# Patient Record
Sex: Male | Born: 1965
Health system: Southern US, Community
[De-identification: ages and names within clinical notes are randomized; demographics above are authoritative.]

## PROBLEM LIST (undated history)

## (undated) DIAGNOSIS — R569 Unspecified convulsions: Secondary | ICD-10-CM

## (undated) DIAGNOSIS — M109 Gout, unspecified: Secondary | ICD-10-CM

## (undated) DIAGNOSIS — I10 Essential (primary) hypertension: Secondary | ICD-10-CM

## (undated) DIAGNOSIS — L4050 Arthropathic psoriasis, unspecified: Secondary | ICD-10-CM

## (undated) HISTORY — PX: CERVICAL SPINE SURGERY: SHX589

## (undated) HISTORY — DX: Arthropathic psoriasis, unspecified: L40.50

## (undated) HISTORY — DX: Essential (primary) hypertension: I10

## (undated) HISTORY — DX: Gout, unspecified: M10.9

## (undated) HISTORY — DX: Unspecified convulsions: R56.9

---

## 1999-03-08 ENCOUNTER — Encounter: Payer: Self-pay | Admitting: Internal Medicine

## 1999-03-08 ENCOUNTER — Ambulatory Visit (HOSPITAL_COMMUNITY): Admission: RE | Admit: 1999-03-08 | Discharge: 1999-03-08 | Payer: Self-pay | Admitting: Internal Medicine

## 2004-04-24 ENCOUNTER — Emergency Department: Payer: Self-pay | Admitting: Emergency Medicine

## 2004-05-12 ENCOUNTER — Ambulatory Visit: Payer: Self-pay | Admitting: Internal Medicine

## 2004-05-21 ENCOUNTER — Ambulatory Visit: Payer: Self-pay | Admitting: Internal Medicine

## 2004-06-07 ENCOUNTER — Ambulatory Visit: Payer: Self-pay | Admitting: Internal Medicine

## 2004-06-10 ENCOUNTER — Ambulatory Visit: Payer: Self-pay | Admitting: Internal Medicine

## 2004-06-15 ENCOUNTER — Ambulatory Visit: Payer: Self-pay | Admitting: Internal Medicine

## 2004-10-20 ENCOUNTER — Encounter: Admission: RE | Admit: 2004-10-20 | Discharge: 2004-10-20 | Payer: Self-pay | Admitting: Internal Medicine

## 2005-01-18 ENCOUNTER — Ambulatory Visit: Payer: Self-pay | Admitting: Family Medicine

## 2005-02-10 ENCOUNTER — Ambulatory Visit: Payer: Self-pay | Admitting: Family Medicine

## 2005-02-14 ENCOUNTER — Ambulatory Visit: Payer: Self-pay | Admitting: Family Medicine

## 2005-02-28 ENCOUNTER — Ambulatory Visit: Payer: Self-pay | Admitting: Family Medicine

## 2005-03-01 ENCOUNTER — Ambulatory Visit: Payer: Self-pay | Admitting: Cardiology

## 2005-03-02 ENCOUNTER — Ambulatory Visit: Payer: Self-pay | Admitting: Family Medicine

## 2005-05-04 ENCOUNTER — Ambulatory Visit: Payer: Self-pay | Admitting: Family Medicine

## 2005-05-18 ENCOUNTER — Ambulatory Visit: Payer: Self-pay | Admitting: Family Medicine

## 2005-06-20 ENCOUNTER — Ambulatory Visit: Payer: Self-pay | Admitting: Family Medicine

## 2006-06-08 ENCOUNTER — Emergency Department (HOSPITAL_COMMUNITY): Admission: EM | Admit: 2006-06-08 | Discharge: 2006-06-09 | Payer: Self-pay | Admitting: Emergency Medicine

## 2006-06-16 ENCOUNTER — Encounter: Admission: RE | Admit: 2006-06-16 | Discharge: 2006-06-16 | Payer: Self-pay | Admitting: Internal Medicine

## 2006-06-30 ENCOUNTER — Ambulatory Visit: Payer: Self-pay | Admitting: Family Medicine

## 2006-09-25 ENCOUNTER — Inpatient Hospital Stay (HOSPITAL_COMMUNITY): Admission: RE | Admit: 2006-09-25 | Discharge: 2006-09-26 | Payer: Self-pay | Admitting: Neurosurgery

## 2006-12-15 ENCOUNTER — Encounter (INDEPENDENT_AMBULATORY_CARE_PROVIDER_SITE_OTHER): Payer: Self-pay | Admitting: Internal Medicine

## 2006-12-20 ENCOUNTER — Telehealth (INDEPENDENT_AMBULATORY_CARE_PROVIDER_SITE_OTHER): Payer: Self-pay | Admitting: Internal Medicine

## 2007-01-20 ENCOUNTER — Emergency Department: Payer: Self-pay | Admitting: Emergency Medicine

## 2007-02-12 ENCOUNTER — Encounter (INDEPENDENT_AMBULATORY_CARE_PROVIDER_SITE_OTHER): Payer: Self-pay | Admitting: Internal Medicine

## 2007-05-04 ENCOUNTER — Encounter (INDEPENDENT_AMBULATORY_CARE_PROVIDER_SITE_OTHER): Payer: Self-pay | Admitting: Internal Medicine

## 2007-05-14 DIAGNOSIS — L4050 Arthropathic psoriasis, unspecified: Secondary | ICD-10-CM | POA: Insufficient documentation

## 2007-05-28 ENCOUNTER — Ambulatory Visit: Payer: Self-pay | Admitting: Family Medicine

## 2007-05-28 DIAGNOSIS — H6120 Impacted cerumen, unspecified ear: Secondary | ICD-10-CM | POA: Insufficient documentation

## 2007-05-28 DIAGNOSIS — H6121 Impacted cerumen, right ear: Secondary | ICD-10-CM | POA: Insufficient documentation

## 2007-07-06 ENCOUNTER — Encounter (INDEPENDENT_AMBULATORY_CARE_PROVIDER_SITE_OTHER): Payer: Self-pay | Admitting: Internal Medicine

## 2007-07-11 ENCOUNTER — Emergency Department (HOSPITAL_COMMUNITY): Admission: EM | Admit: 2007-07-11 | Discharge: 2007-07-11 | Payer: Self-pay | Admitting: Emergency Medicine

## 2007-09-03 ENCOUNTER — Encounter (INDEPENDENT_AMBULATORY_CARE_PROVIDER_SITE_OTHER): Payer: Self-pay | Admitting: Internal Medicine

## 2007-10-25 ENCOUNTER — Encounter (INDEPENDENT_AMBULATORY_CARE_PROVIDER_SITE_OTHER): Payer: Self-pay | Admitting: Internal Medicine

## 2008-01-17 ENCOUNTER — Ambulatory Visit: Payer: Self-pay | Admitting: Family Medicine

## 2008-01-18 ENCOUNTER — Telehealth (INDEPENDENT_AMBULATORY_CARE_PROVIDER_SITE_OTHER): Payer: Self-pay | Admitting: Internal Medicine

## 2008-01-22 ENCOUNTER — Telehealth: Payer: Self-pay | Admitting: Family Medicine

## 2008-01-26 ENCOUNTER — Ambulatory Visit (HOSPITAL_COMMUNITY): Admission: AD | Admit: 2008-01-26 | Discharge: 2008-01-26 | Payer: Self-pay | Admitting: Internal Medicine

## 2008-01-26 ENCOUNTER — Ambulatory Visit: Payer: Self-pay | Admitting: Internal Medicine

## 2008-01-26 DIAGNOSIS — R05 Cough: Secondary | ICD-10-CM | POA: Insufficient documentation

## 2008-01-26 DIAGNOSIS — R059 Cough, unspecified: Secondary | ICD-10-CM | POA: Insufficient documentation

## 2008-01-26 DIAGNOSIS — R051 Acute cough: Secondary | ICD-10-CM | POA: Insufficient documentation

## 2008-02-20 ENCOUNTER — Encounter (INDEPENDENT_AMBULATORY_CARE_PROVIDER_SITE_OTHER): Payer: Self-pay | Admitting: Internal Medicine

## 2008-02-20 ENCOUNTER — Ambulatory Visit: Payer: Self-pay | Admitting: Family Medicine

## 2008-02-20 DIAGNOSIS — J189 Pneumonia, unspecified organism: Secondary | ICD-10-CM | POA: Insufficient documentation

## 2008-02-26 ENCOUNTER — Telehealth: Payer: Self-pay | Admitting: Family Medicine

## 2008-02-27 ENCOUNTER — Encounter (INDEPENDENT_AMBULATORY_CARE_PROVIDER_SITE_OTHER): Payer: Self-pay | Admitting: Internal Medicine

## 2008-04-14 ENCOUNTER — Ambulatory Visit: Payer: Self-pay | Admitting: Family Medicine

## 2008-04-21 ENCOUNTER — Telehealth: Payer: Self-pay | Admitting: Family Medicine

## 2008-05-13 ENCOUNTER — Telehealth (INDEPENDENT_AMBULATORY_CARE_PROVIDER_SITE_OTHER): Payer: Self-pay | Admitting: Internal Medicine

## 2008-08-02 ENCOUNTER — Emergency Department (HOSPITAL_COMMUNITY): Admission: EM | Admit: 2008-08-02 | Discharge: 2008-08-02 | Payer: Self-pay | Admitting: Emergency Medicine

## 2008-12-25 ENCOUNTER — Encounter: Admission: RE | Admit: 2008-12-25 | Discharge: 2008-12-25 | Payer: Self-pay | Admitting: Internal Medicine

## 2009-01-15 ENCOUNTER — Ambulatory Visit: Payer: Self-pay | Admitting: Family Medicine

## 2009-03-24 ENCOUNTER — Ambulatory Visit: Payer: Self-pay | Admitting: Family Medicine

## 2009-03-24 ENCOUNTER — Encounter (INDEPENDENT_AMBULATORY_CARE_PROVIDER_SITE_OTHER): Payer: Self-pay | Admitting: *Deleted

## 2009-08-26 ENCOUNTER — Ambulatory Visit: Payer: Self-pay | Admitting: Family Medicine

## 2009-08-26 DIAGNOSIS — S8990XA Unspecified injury of unspecified lower leg, initial encounter: Secondary | ICD-10-CM | POA: Insufficient documentation

## 2009-08-26 DIAGNOSIS — S99919A Unspecified injury of unspecified ankle, initial encounter: Secondary | ICD-10-CM | POA: Insufficient documentation

## 2009-08-26 DIAGNOSIS — S99929A Unspecified injury of unspecified foot, initial encounter: Secondary | ICD-10-CM

## 2009-09-30 ENCOUNTER — Encounter: Payer: Self-pay | Admitting: Family Medicine

## 2009-12-29 ENCOUNTER — Ambulatory Visit: Payer: Self-pay | Admitting: Family Medicine

## 2009-12-29 DIAGNOSIS — I10 Essential (primary) hypertension: Secondary | ICD-10-CM | POA: Insufficient documentation

## 2010-01-05 ENCOUNTER — Ambulatory Visit: Payer: Self-pay | Admitting: Family Medicine

## 2010-01-05 LAB — CONVERTED CEMR LAB
Cholesterol: 137 mg/dL (ref 0–200)
Triglycerides: 53 mg/dL (ref 0.0–149.0)

## 2010-02-11 ENCOUNTER — Ambulatory Visit: Payer: Self-pay | Admitting: Family Medicine

## 2010-02-19 ENCOUNTER — Telehealth: Payer: Self-pay | Admitting: Family Medicine

## 2010-06-20 ENCOUNTER — Encounter: Payer: Self-pay | Admitting: Internal Medicine

## 2010-07-01 NOTE — Assessment & Plan Note (Signed)
Summary: hurt foot while playing kick ball/ alc   Vital Signs:  Patient profile:   45 year old male Height:      77 inches Weight:      297 pounds BMI:     35.35 Temp:     97.6 degrees F oral Pulse rate:   76 / minute Pulse rhythm:   regular BP sitting:   126 / 80  (left arm) Cuff size:   large  Vitals Entered By: Delilah Shan CMA [place] Dull) (August 26, 2009 11:40 AM) CC: Hurt foot while playing kickball   History of Present Illness: 45 yo male here for acute injury of left ankle. Was playing kickball yesterday, stepped off on left foot to kick with is righ and felt immediate pain on medial aspect of left ankle and back of foot.  Immediately hurt to bear weight, localized swelling. Did not twist ankle, did not feel anything pop.  Has an aircast at home which he has been wearing from when he sprained his other ankle.  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  Allergies: No Known Drug Allergies  Review of Systems      See HPI MS:  Complains of joint swelling and loss of strength; denies joint redness.  Physical Exam  General:  Well-developed,well-nourished,in no acute distress; alert,appropriate and cooperative throughout examination Msk:  Left foot: no echymosis or erythema, small area of edema on medial aspect.Pos talar tilt test. Very limited range of motion, pain with weight bearing. Psych:  Cognition and judgment appear intact. Alert and cooperative with normal attention span and concentration. No apparent delusions, illusions, hallucinations   Impression & Recommendations:  Problem # 1:  ANKLE INJURY, LEFT (ICD-959.7) Assessment New Given limited range of motion and decreased stability, will refer to ortho immediately for r/u fracture/ ligamentous tear. COntinue Ibuprofen and wearing air cast until he can be seen. Orders: Orthopedic Referral (Ortho)  Complete Medication List: 1)  Lisinopril 20 Mg Tabs  (Lisinopril) .... Take 1 tablet by mouth once a day 2)  Methotrexate 2.5 Mg Tabs (Methotrexate sodium) .... 4 tabs every sat & sun 3)  Enbrel 50 Mg/ml Soln (Etanercept) .Marland Kitchen.. 1 injection every week  Patient Instructions: 1)  Please put aircast on. 2)  Take 400-600 mg of Ibuprofen (Advil, Motrin) with food every 4-6 hours as needed  for relief of pain or comfort. 3)  Please stop by to see Shirlee Limerick.   Current Allergies (reviewed today): No known allergies

## 2010-07-01 NOTE — Assessment & Plan Note (Signed)
Summary: SORE THROAT, CHEST CONGESTION/ 2:30   Vital Signs:  Patient profile:   45 year old male Height:      77 inches Weight:      297.50 pounds BMI:     35.41 Temp:     98.2 degrees F oral Pulse rate:   80 / minute Pulse rhythm:   regular BP sitting:   110 / 80  (left arm) Cuff size:   large  Vitals Entered By: Delilah Shan CMA [place] Dull) (February 11, 2010 2:36 PM) CC: ST, chest congestion.  Patient says his throat burns some but chest hurts with deep breath.   History of Present Illness: Pain with deep breath, near sternum.  ST and burning.  Initially with ST.  Cough at night.  No sputum.  Started Friday.  No fevers.  Arm are achy.  No rash.  No NAVD.  Pain on ant chest when crossing arms on chest.   On enbrel.  Occ wheeze, minimal per patient.   Allergies: No Known Drug Allergies  Past History:  Past Medical History: Last updated: 12/29/2009 psoriatic arthritis- Dr. Kellie Simmering on embrel and mtx no hx lung disease  HTN  Review of Systems       See HPI.  Otherwise negative.    Physical Exam  General:  GEN: nad, alert and oriented HEENT: mucous membranes moist, TM w/o erythema, nasal epithelium injected, OP with cobblestoning NECK: supple w/o LA CV: rrr. PULM: ctab, no inc wob, ant chest wall tender to palpation  ABD: soft, +bs EXT: no edema    Impression & Recommendations:  Problem # 1:  URI (ICD-465.9) I would treat given his immunologic meds.  Nontoxic and okay for outpatient follow up.  Out of work in meantime and supportive tx o/w.  He understands.  follow up as needed.  His updated medication list for this problem includes:    Zyrtec Allergy 10 Mg Tabs (Cetirizine hcl) .Marland Kitchen... Take 1 tablet by mouth once a day    Hydrocodone-homatropine 5-1.5 Mg/37ml Syrp (Hydrocodone-homatropine) .Marland KitchenMarland KitchenMarland KitchenMarland Kitchen 5ml by mouth by mouth q6h as needed for cough  Complete Medication List: 1)  Lisinopril 20 Mg Tabs (Lisinopril) .... Take 1 tablet by mouth once a day 2)  Methotrexate 2.5  Mg Tabs (Methotrexate sodium) .... 4 tabs every sat & sun 3)  Enbrel 50 Mg/ml Soln (Etanercept) .Marland Kitchen.. 1 injection every week 4)  Zyrtec Allergy 10 Mg Tabs (Cetirizine hcl) .... Take 1 tablet by mouth once a day 5)  Zithromax 250 Mg Tabs (Azithromycin) .... 2 by mouth today then 1 by mouth for 4 days. 6)  Hydrocodone-homatropine 5-1.5 Mg/21ml Syrp (Hydrocodone-homatropine) .... 5ml by mouth by mouth q6h as needed for cough  Patient Instructions: 1)  Go back to work when cough resolved.  Potentially contagious.  2)  The cough syrup can make you drowsy.  This should get better.  Let me know if you don't improve.  Prescriptions: HYDROCODONE-HOMATROPINE 5-1.5 MG/5ML SYRP (HYDROCODONE-HOMATROPINE) 5mL by mouth by mouth q6h as needed for cough  #168mL x 0   Entered and Authorized by:   Crawford Givens MD   Signed by:   Crawford Givens MD on 02/11/2010   Method used:   Print then Give to Patient   RxID:   [id number] ZITHROMAX 250 MG TABS (AZITHROMYCIN) 2 by mouth today then 1 by mouth for 4 days.  #6 x 0   Entered and Authorized by:   Crawford Givens MD   Signed by:   Crawford Givens MD  on 02/11/2010   Method used:   Print then Give to Patient   RxID:   [id number]   Current Allergies (reviewed today): No known allergies

## 2010-07-01 NOTE — Progress Notes (Signed)
Summary: hydrocodone  Phone Note Refill Request Message from:  Fax from Pharmacy on February 19, 2010 3:33 PM  Refills Requested: Medication #1:  HYDROCODONE-HOMATROPINE 5-1.5 MG/5ML SYRP 5mL by mouth by mouth q6h as needed for cough.   Last Refilled: 02/11/2010 Refill request from [facility]. [phone]  Initial call taken by: Melody Comas,  February 19, 2010 3:33 PM  Follow-up for Phone Call        ok to refill.  please call in and notify patient. Follow-up by: Eustaquio Boyden  MD,  February 19, 2010 3:40 PM  Additional Follow-up for Phone Call Additional follow up Details #1::        Rx called in as directed and patient is aware Additional Follow-up by: Janee Morn CMA [place] Dull),  February 19, 2010 3:51 PM    Prescriptions: HYDROCODONE-HOMATROPINE 5-1.5 MG/5ML SYRP (HYDROCODONE-HOMATROPINE) 5mL by mouth by mouth q6h as needed for cough  #164mL x 0   Entered and Authorized by:   Eustaquio Boyden  MD   Signed by:   Eustaquio Boyden  MD on 02/19/2010   Method used:   Telephoned to ...       [facility]* (retail)       [address]       [place], [place]  [address]       Ph: [phone]       Fax: [phone]   RxID:   [id number]

## 2010-07-01 NOTE — Assessment & Plan Note (Signed)
Summary: TRANSFER FROM [facility]/REFILL MED/CLE   Vital Signs:  Patient profile:   45 year old male Height:      77 inches Weight:      298.75 pounds BMI:     35.55 Temp:     98.6 degrees F oral Pulse rate:   92 / minute Pulse rhythm:   regular BP sitting:   112 / 70  (left arm) Cuff size:   large  Vitals Entered By: Delilah Shan CMA Timm Dull) (December 29, 2009 4:13 PM) CC: Transfer from [facility]  - Refill Med   History of Present Illness: Sees Dr. Kellie Simmering for RA.  Going on since  ~2006.  Hypertension:      Using medication without problems or lightheadedness: yes Chest pain with exertion:no Edema:no Short of breath:no Average home BPs: not checked but okay at rheum office Other issues: needs refill  Allergies: No Known Drug Allergies  Past History:  Past Medical History: psoriatic arthritis- Dr. 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  Otherwise negative.    Physical Exam  General:  GEN: nad, alert and oriented HEENT: mucous membranes moist NECK: supple w/o LA CV: rrr.  no murmur PULM: ctab, no inc wob ABD: soft, +bs EXT: no edema SKIN: no acute rash    Impression & Recommendations:  Problem # 1:  HYPERTENSION, CONTROLLED (ICD-401.1) No change in meds.  Return for lipids.  D/w patient [id number].  he is not eating breakfast.  He may be able to lose some weight if he spreads he calories out during the day.  Continue exercise, of which he does a lot at work.  His updated medication list for this problem includes:    Lisinopril 20 Mg Tabs (Lisinopril) .Marland Kitchen... Take 1 tablet by mouth once a day  Complete Medication List: 1)  Lisinopril 20 Mg Tabs (Lisinopril) .... Take 1 tablet by mouth once a day 2)  Methotrexate 2.5 Mg Tabs (Methotrexate sodium) .... 4 tabs every sat & sun 3)  Enbrel 50 Mg/ml Soln (Etanercept) .Marland Kitchen.. 1 injection every week 4)  Zyrtec  Allergy 10 Mg Tabs (Cetirizine hcl) .... Take 1 tablet by mouth once a day  Patient Instructions: 1)  Come back for fasting labs.  2)  lipid. 401.1.  Prescriptions: LISINOPRIL 20 MG  TABS (LISINOPRIL) Take 1 tablet by mouth once a day  #90 x 3   Entered and Authorized by:   Crawford Givens MD   Signed by:   Crawford Givens MD on 12/29/2009   Method used:   Electronically to        [facility]* (retail)       [address]       [place], [place]  [address]       Ph: [phone]       Fax: [phone]   RxID:   [id number]   Current Allergies (reviewed today): No known allergies

## 2010-07-02 NOTE — Consult Note (Signed)
Summary: [facility]   Imported By: Lanelle Bal 10/06/2009 11:36:24  _____________________________________________________________________  External Attachment:    Type:   Image     Comment:   External Document

## 2010-08-02 ENCOUNTER — Encounter: Payer: Self-pay | Admitting: Family Medicine

## 2010-08-02 ENCOUNTER — Ambulatory Visit (INDEPENDENT_AMBULATORY_CARE_PROVIDER_SITE_OTHER): Payer: BC Managed Care – PPO | Admitting: Family Medicine

## 2010-08-02 ENCOUNTER — Ambulatory Visit: Payer: Self-pay | Admitting: Family Medicine

## 2010-08-02 DIAGNOSIS — R059 Cough, unspecified: Secondary | ICD-10-CM

## 2010-08-02 DIAGNOSIS — R05 Cough: Secondary | ICD-10-CM

## 2010-08-10 NOTE — Assessment & Plan Note (Signed)
Summary: CHEST CONGESTION/CLE   [employer]   Vital Signs:  Patient profile:   45 year old male Height:      77 inches Weight:      299.25 pounds BMI:     35.61 Temp:     98.8 degrees F oral Pulse rate:   80 / minute Pulse rhythm:   regular BP sitting:   140 / 72  (left arm) Cuff size:   large  Vitals Entered By: Delilah Shan CMA [place] Dull) (August 02, 2010 3:16 PM) CC: Chest congestion   History of Present Illness: Burning ST intially, now in his chest since Friday.  Voice change.  Sx worse at night.  No fevers.  On enbrel and MTX.  No sputum.  Occ wheeze.  Used some SABA prev but not with this episode.  No ear pain.   Occ rhinorrhea.  Can get a deep breath but this triggers cough, as does the cold air. Sweats during the day and chills at night.    Allergies: No Known Drug Allergies  Past History:  Past Medical History: psoriatic arthritis- Dr. Kellie Simmering on embrel and mtx no hx lung disease  HTN SZ in childhood, none since 5th grade  Review of Systems       See HPI.  Otherwise negative.    Physical Exam  General:  GEN: nad, alert and oriented HEENT: mucous membranes moist, TM w/o erythema, nasal epithelium injected, OP with cobblestoning NECK: supple w/o LA CV: rrr. PULM: ctab, no inc wob,cough noted ABD: soft, +bs EXT: no edema    Impression & Recommendations:  Problem # 1:  COUGH (ICD-786.2) I would start the antibiotics given his immune status with his meds.  Nontoxic and follow up as needed.  Supporitve tx o/w.  He agrees.  lungs clear to auscultation bilaterally w/o focal decrease in bs.  I would treat him empirically for bronchitis.  follow up as needed.    Complete Medication List: 1)  Lisinopril 20 Mg Tabs (Lisinopril) .... Take 1 tablet by mouth once a day 2)  Methotrexate 2.5 Mg Tabs (Methotrexate sodium) .... 4 tabs every sat & sun 3)  Enbrel 50 Mg/ml Soln (Etanercept) .Marland Kitchen.. 1 injection every week 4)  Zyrtec Allergy 10 Mg Tabs (Cetirizine hcl) .... Take 1  tablet by mouth once a day 5)  Hydrocodone-homatropine 5-1.5 Mg/50ml Syrp (Hydrocodone-homatropine) .... 5ml by mouth at bedtime for cough 6)  Zithromax 250 Mg Tabs (Azithromycin) .... 2 by mouth today and then 1 by mouth once daily  Patient Instructions: 1)  Use the cough medicine as needed.  It can make you drowsy.  Start the antibiotics today.  Drink plenty of fluids.   Prescriptions: ZITHROMAX 250 MG TABS (AZITHROMYCIN) 2 by mouth today and then 1 by mouth once daily  #6 x 0   Entered and Authorized by:   Crawford Givens MD   Signed by:   Crawford Givens MD on 08/02/2010   Method used:   Print then Give to Patient   RxID:   [id number] HYDROCODONE-HOMATROPINE 5-1.5 MG/5ML SYRP (HYDROCODONE-HOMATROPINE) 5ml by mouth at bedtime for cough  #6oz x 0   Entered and Authorized by:   Crawford Givens MD   Signed by:   Crawford Givens MD on 08/02/2010   Method used:   Print then Give to Patient   RxID:   [id number]    Orders Added: 1)  Est. Patient Level III [42595]   Immunization History:  Tetanus/Td Immunization History:    Tetanus/Td:  historical (01/20/2007)   Immunization History:  Tetanus/Td Immunization History:    Tetanus/Td:  Historical (01/20/2007)  Current Allergies (reviewed today): No known allergies

## 2010-08-17 ENCOUNTER — Other Ambulatory Visit: Payer: Self-pay | Admitting: Family Medicine

## 2010-08-17 MED ORDER — HYDROCODONE-HOMATROPINE 5-1.5 MG/5ML PO SYRP: ORAL_SOLUTION | ORAL | Status: DC

## 2010-08-18 NOTE — Telephone Encounter (Signed)
Phoned in on 08/17/2010

## 2010-08-18 NOTE — Telephone Encounter (Signed)
Dr. Borton patient 

## 2010-09-02 ENCOUNTER — Other Ambulatory Visit: Payer: Self-pay

## 2010-09-02 MED ORDER — HYDROCODONE-HOMATROPINE 5-1.5 MG/5ML PO SYRP: ORAL_SOLUTION | ORAL | Status: DC

## 2010-09-02 NOTE — Telephone Encounter (Signed)
Medication phoned to [facility] pharmacy as instructed. Left message for pt to call back.

## 2010-09-02 NOTE — Telephone Encounter (Signed)
Please advise if refill ok, last refilled 08/17/2010 by Dr.Perkey.

## 2010-09-02 NOTE — Telephone Encounter (Signed)
May refill one more time of one bottle, no refills but if cough not resolved, because of meds he is on which can blunt the immune system, think he needs to be seen again in follow up. This medicine is habit forming as well and is not good to be taken for prolonged periods.

## 2010-09-07 NOTE — Telephone Encounter (Signed)
Patient's wife notified as instructed by telephone. Pt's wife said pt is feeling better but still has cough. She will talk with pt and call back for follow up appt.

## 2010-10-14 ENCOUNTER — Encounter: Payer: Self-pay | Admitting: Family Medicine

## 2010-10-15 ENCOUNTER — Ambulatory Visit: Payer: BC Managed Care – PPO | Admitting: Family Medicine

## 2010-10-15 NOTE — Op Note (Signed)
Foster, Mike                 ACCOUNT NO.:  [id number]   MEDICAL RECORD NO.:  [id number]          PATIENT TYPE:  INP   LOCATION:  3005                         FACILITY:  [facility]   PHYSICIAN:  Coletta Memos, M.D.     DATE OF BIRTH:  [birth year]   DATE OF PROCEDURE:  09/25/2006  DATE OF DISCHARGE:                               OPERATIVE REPORT   PREOPERATIVE DIAGNOSIS:  Cervical spondylosis, cervical displaced disk  with myelopathy, cervical spondylosis with myelopathy, neck pain.   POSTOPERATIVE DIAGNOSES:  Cervical spondylosis, cervical displaced disk  with myelopathy, cervical spondylosis with myelopathy, neck pain.   PROCEDURE:  1. Anterior cervical decompression C5-6.  2. Arthrodesis with 8-mm interbody plugs  3. Anterior instrumentation 16 mm Vector plate.   COMPLICATIONS:  None.   SURGEON:  Coletta Memos, M.D.   ANESTHESIA:  General endotracheal.   INDICATIONS:  Mike Foster is a 45 year old presented with  significant loss of function in his arms and legs.  He had numbness,  tingling sensation in both hands.  Positive Lhermitte sign.  MRI showed  a C5-6 displaced disk and abnormal cord signal at that level.  I  therefore recommended, he agreed to undergo operative decompression.   OPERATIVE NOTE:  Mike Foster was brought to the operating room intubated  and placed under a general anesthetic without difficulty.  His head was  positioned on a horseshoe headrest.  His neck was prepped and he was  draped in sterile fashion.  I infiltrated 4 mL 0.5% lidocaine with  1:200,000 epinephrine starting from the midline extending to the medial  border of the left sternocleidomastoid.  I opened the skin with a #10  blade and I took this down to the platysma.  I opened the platysma in a  horizontal fashion.  I dissected in a plane both superior and inferior  to the platysma rostrally and caudally.  I then with sharp and blunt  dissection created an avascular corridor to the spine  between the strap  muscles and sternocleidomastoid laterally.  I placed a spinal needle and  showed I was at the C5-6 space.  I reflected the longus colli muscles  bilaterally and placed self-retaining retractors.  I opened the disk  space and used curettes to remove some of the disk.  I then placed a  distraction pins one at C5, the other at C6 and distracted the disk  space.   I performed the decompression by removing the rest of the disk using a  high-speed drill and curettes.  Large osteophyte was encountered  posteriorly which I drilled down.  Then very thickened posterior  longitudinal ligament was identified.  I was able to go through that and  remove that using a Kerrison punch.  Both nerve roots were then very  well decompressed on both the right and left side as was the spinal  canal.  I then prepared for the arthrodesis.   I drilled the endplates and evened the inferior endplate of C5 superior  of C6.  I then placed an 8 mm graft after sizing it.  The  graft was  secured into position and felt like a very good fit.  I removed the  distraction pins.  I then placed anterior instrumentation.   I placed an anterior plate 16 mm with two screws at C5 and two screws at  C6.  X-ray showed that the plate, plug and screws were in good position.  I then irrigated the wound.  I then closed wound in layered fashion  using Vicryl sutures.  This was done to reapproximate the platysma and  subcuticular layer.  Dermabond used for sterile dressing.  Mike Foster  tolerated the procedure well.           ______________________________  Coletta Memos, M.D.     KC/MEDQ  D:  09/25/2006  T:  09/26/2006  Job:  [id number]

## 2010-11-29 ENCOUNTER — Other Ambulatory Visit: Payer: Self-pay | Admitting: Rheumatology

## 2010-11-29 DIAGNOSIS — M545 Low back pain, unspecified: Secondary | ICD-10-CM

## 2010-12-04 ENCOUNTER — Ambulatory Visit
Admission: RE | Admit: 2010-12-04 | Discharge: 2010-12-04 | Disposition: A | Discharge status: Home / Self Care | Payer: BC Managed Care – PPO | Source: Ambulatory Visit | Attending: Rheumatology | Admitting: Rheumatology

## 2010-12-04 DIAGNOSIS — M545 Low back pain, unspecified: Secondary | ICD-10-CM

## 2010-12-08 ENCOUNTER — Other Ambulatory Visit: Payer: Self-pay | Admitting: Rheumatology

## 2010-12-08 DIAGNOSIS — M5410 Radiculopathy, site unspecified: Secondary | ICD-10-CM

## 2010-12-09 ENCOUNTER — Ambulatory Visit
Admission: RE | Admit: 2010-12-09 | Discharge: 2010-12-09 | Disposition: A | Discharge status: Home / Self Care | Payer: BC Managed Care – PPO | Source: Ambulatory Visit | Attending: Rheumatology | Admitting: Rheumatology

## 2010-12-09 DIAGNOSIS — M5410 Radiculopathy, site unspecified: Secondary | ICD-10-CM

## 2010-12-09 MED ORDER — METHYLPREDNISOLONE ACETATE 40 MG/ML INJ SUSP (RADIOLOG
120.0000 mg | Freq: Once | INTRAMUSCULAR | Status: AC
  Administered 2010-12-09: 120 mg via EPIDURAL

## 2010-12-31 ENCOUNTER — Encounter: Payer: Self-pay | Admitting: Family Medicine

## 2010-12-31 ENCOUNTER — Ambulatory Visit (INDEPENDENT_AMBULATORY_CARE_PROVIDER_SITE_OTHER): Payer: BC Managed Care – PPO | Admitting: Family Medicine

## 2010-12-31 VITALS — BP 140/90 | HR 68 | Temp 98.5°F | Wt 292.0 lb

## 2010-12-31 DIAGNOSIS — R059 Cough, unspecified: Secondary | ICD-10-CM

## 2010-12-31 DIAGNOSIS — R05 Cough: Secondary | ICD-10-CM

## 2010-12-31 MED ORDER — HYDROCODONE-HOMATROPINE 5-1.5 MG/5ML PO SYRP: ORAL_SOLUTION | ORAL | Status: DC

## 2010-12-31 MED ORDER — AZITHROMYCIN 250 MG PO TABS: ORAL_TABLET | ORAL | Status: DC

## 2010-12-31 NOTE — Progress Notes (Signed)
Sx started last week, tightness in chest.  Progressive cough, no sputum.  No fevers.  On embrel and MTX.  ST, still present.  Chest sx are the most troubling.  Fatigued.  No NAVDR.  No chills/sweats.  No ear pain.  Some facial pain. He's out of work today and this doesn't happen unless he feels notably lousy.   Meds, vitals, and allergies reviewed.   ROS: See HPI.  Otherwise, noncontributory.  GEN: nad, alert and oriented HEENT: mucous membranes moist, tm w/o erythema, nasal exam w/o erythema, some clear and some purulent discharge noted,  OP with cobblestoning NECK: supple w/o LA CV: rrr.   PULM: ctab, no inc wob, dry cough noted EXT: no edema SKIN: no acute rash

## 2010-12-31 NOTE — Patient Instructions (Signed)
Star the antibiotics today.  Drink plenty of fluids, take the cough medicine as needed (it can make you drowsy), and gargle with warm salt water for your throat.  This should gradually improve.  Take care.  Let us know if you have other concerns.

## 2010-12-31 NOTE — Assessment & Plan Note (Addendum)
Given MTX and enbrel, start abx and use cough meds prn.  Okay for outpatient f/u.  I talked to him about the ddx, but given the immunosuppression, I would start the abx.  Call back or f/u prn.  He agrees.

## 2011-01-03 ENCOUNTER — Telehealth: Payer: Self-pay | Admitting: *Deleted

## 2011-01-03 MED ORDER — BENZONATATE 200 MG PO CAPS: 200.0000 mg | ORAL_CAPSULE | Freq: Three times a day (TID) | ORAL | Status: AC | PRN

## 2011-01-03 NOTE — Telephone Encounter (Signed)
I would try tessalon for cough.  I tried to call the pt, but I didn't get an answer.  Please try to call them tomorrow.  I sent the rx.  Thanks.

## 2011-01-03 NOTE — Telephone Encounter (Signed)
Patient's wife says that the patient was seen last week and was given a Rx for some cough medication that had worked for him in the past but now it is not working very well.  Could he try something else?  They use [facility].

## 2011-01-04 NOTE — Telephone Encounter (Signed)
Patient notified

## 2011-01-11 ENCOUNTER — Emergency Department (HOSPITAL_COMMUNITY): Payer: BC Managed Care – PPO

## 2011-01-11 ENCOUNTER — Telehealth: Payer: Self-pay | Admitting: Family Medicine

## 2011-01-11 ENCOUNTER — Emergency Department (HOSPITAL_COMMUNITY)
Admission: EM | Admit: 2011-01-11 | Discharge: 2011-01-11 | Disposition: A | Discharge status: Home / Self Care | Payer: BC Managed Care – PPO | Attending: Emergency Medicine | Admitting: Emergency Medicine

## 2011-01-11 DIAGNOSIS — Z862 Personal history of diseases of the blood and blood-forming organs and certain disorders involving the immune mechanism: Secondary | ICD-10-CM | POA: Insufficient documentation

## 2011-01-11 DIAGNOSIS — M069 Rheumatoid arthritis, unspecified: Secondary | ICD-10-CM | POA: Insufficient documentation

## 2011-01-11 DIAGNOSIS — R05 Cough: Secondary | ICD-10-CM | POA: Insufficient documentation

## 2011-01-11 DIAGNOSIS — K219 Gastro-esophageal reflux disease without esophagitis: Secondary | ICD-10-CM | POA: Insufficient documentation

## 2011-01-11 DIAGNOSIS — R0789 Other chest pain: Secondary | ICD-10-CM | POA: Insufficient documentation

## 2011-01-11 DIAGNOSIS — Z79899 Other long term (current) drug therapy: Secondary | ICD-10-CM | POA: Insufficient documentation

## 2011-01-11 DIAGNOSIS — R42 Dizziness and giddiness: Secondary | ICD-10-CM | POA: Insufficient documentation

## 2011-01-11 DIAGNOSIS — R059 Cough, unspecified: Secondary | ICD-10-CM | POA: Insufficient documentation

## 2011-01-11 DIAGNOSIS — R51 Headache: Secondary | ICD-10-CM | POA: Insufficient documentation

## 2011-01-11 DIAGNOSIS — I10 Essential (primary) hypertension: Secondary | ICD-10-CM | POA: Insufficient documentation

## 2011-01-11 DIAGNOSIS — Z8639 Personal history of other endocrine, nutritional and metabolic disease: Secondary | ICD-10-CM | POA: Insufficient documentation

## 2011-01-11 DIAGNOSIS — R55 Syncope and collapse: Secondary | ICD-10-CM | POA: Insufficient documentation

## 2011-01-11 LAB — COMPREHENSIVE METABOLIC PANEL
ALT: 30 U/L (ref 0–53)
AST: 24 U/L (ref 0–37)
Alkaline Phosphatase: 67 U/L (ref 39–117)
CO2: 25 mEq/L (ref 19–32)
Calcium: 9.4 mg/dL (ref 8.4–10.5)
Chloride: 104 mEq/L (ref 96–112)
GFR calc non Af Amer: >60 mL/min (ref >60)
Potassium: 4.2 mEq/L (ref 3.5–5.1)
Sodium: 138 mEq/L (ref 135–145)
Total Bilirubin: 0.5 mg/dL (ref 0.3–1.2)

## 2011-01-11 LAB — CK TOTAL AND CKMB (NOT AT ARMC)
CK, MB: 3.4 ng/mL (ref 0.3–4.0)
Relative Index: 1.9 (ref 0.0–2.5)
Total CK: 178 U/L (ref 7–232)

## 2011-01-11 LAB — CBC
HCT: 42.7 % (ref 39.0–52.0)
Hemoglobin: 15.3 g/dL (ref 13.0–17.0)
MCHC: 35.8 g/dL (ref 30.0–36.0)
RBC: 4.69 MIL/uL (ref 4.22–5.81)
RDW: 13.1 % (ref 11.5–15.5)

## 2011-01-11 LAB — DIFFERENTIAL
Eosinophils Absolute: 0.1 10*3/uL (ref 0.0–0.7)
Monocytes Absolute: 0.7 10*3/uL (ref 0.1–1.0)
Monocytes Relative: 9 % (ref 3–12)
Neutro Abs: 5.9 10*3/uL (ref 1.7–7.7)

## 2011-01-11 LAB — TROPONIN I: Troponin I: <0.3 ng/mL (ref <0.30)

## 2011-01-11 NOTE — Telephone Encounter (Signed)
Please call pt.  I saw the ER note.  Please have him f/u here if his symptoms continue.  Thanks.

## 2011-01-12 NOTE — Telephone Encounter (Signed)
Patient was unavailable.  Wife advised.  She says he still is not feeling well.  They are going to see how he is feeling tomorrow and if not improved, will make appt.

## 2011-01-17 ENCOUNTER — Other Ambulatory Visit: Payer: Self-pay | Admitting: *Deleted

## 2011-01-17 ENCOUNTER — Ambulatory Visit: Payer: BC Managed Care – PPO | Admitting: Family Medicine

## 2011-01-17 MED ORDER — LISINOPRIL 20 MG PO TABS: 20.0000 mg | ORAL_TABLET | Freq: Every day | ORAL | Status: DC

## 2011-02-12 ENCOUNTER — Emergency Department: Payer: Self-pay | Admitting: Emergency Medicine

## 2011-04-05 ENCOUNTER — Telehealth: Payer: Self-pay | Admitting: *Deleted

## 2011-04-05 MED ORDER — AMOXICILLIN 875 MG PO TABS: 875.0000 mg | ORAL_TABLET | Freq: Two times a day (BID) | ORAL | Status: AC

## 2011-04-05 NOTE — Telephone Encounter (Signed)
Patient's wife advised

## 2011-04-05 NOTE — Telephone Encounter (Signed)
Pt's daughters have strep and pt has not been feeling well.  He takes enbrel and wife is asking if he should be given an antibiotic, just in case.  [facility].

## 2011-04-05 NOTE — Telephone Encounter (Signed)
I would send it in, but have the patient not start it yet.  If fever and/or sore throat, then start it.  Thanks.

## 2011-05-10 ENCOUNTER — Ambulatory Visit: Payer: BC Managed Care – PPO | Admitting: Family Medicine

## 2011-05-12 ENCOUNTER — Ambulatory Visit: Payer: BC Managed Care – PPO | Admitting: Family Medicine

## 2011-06-03 ENCOUNTER — Ambulatory Visit: Payer: BC Managed Care – PPO | Admitting: Family Medicine

## 2011-07-04 ENCOUNTER — Other Ambulatory Visit: Payer: Self-pay | Admitting: Neurosurgery

## 2011-07-04 DIAGNOSIS — M5410 Radiculopathy, site unspecified: Secondary | ICD-10-CM

## 2011-07-05 ENCOUNTER — Ambulatory Visit
Admission: RE | Admit: 2011-07-05 | Discharge: 2011-07-05 | Disposition: A | Discharge status: Home / Self Care | Payer: BC Managed Care – PPO | Source: Ambulatory Visit | Attending: Neurosurgery | Admitting: Neurosurgery

## 2011-07-05 DIAGNOSIS — M5410 Radiculopathy, site unspecified: Secondary | ICD-10-CM

## 2011-07-19 ENCOUNTER — Encounter: Payer: Self-pay | Admitting: Family Medicine

## 2011-07-19 ENCOUNTER — Ambulatory Visit (INDEPENDENT_AMBULATORY_CARE_PROVIDER_SITE_OTHER): Payer: BC Managed Care – PPO | Admitting: Family Medicine

## 2011-07-19 DIAGNOSIS — R059 Cough, unspecified: Secondary | ICD-10-CM

## 2011-07-19 DIAGNOSIS — R05 Cough: Secondary | ICD-10-CM

## 2011-07-19 MED ORDER — HYDROCODONE-HOMATROPINE 5-1.5 MG/5ML PO SYRP: ORAL_SOLUTION | ORAL | Status: DC

## 2011-07-19 MED ORDER — AZITHROMYCIN 250 MG PO TABS: ORAL_TABLET | ORAL | Status: AC

## 2011-07-19 NOTE — Patient Instructions (Addendum)
Take the antibiotics, use the cough medicine and I would push back the next round of MTX/enbrel unless you feel much better.  Gargle with warm salt water.  And try to get some rest.

## 2011-07-19 NOTE — Progress Notes (Signed)
Immunocompromised.  A few days of fatigue, cough worse last night.  Chest feels heavy.  ST and HA.  Fevers.  Stuffy, mild rhinorrhea.  Taking mucinex and that is helping a little.  No vomiting, no diarrhea. Took nyquil last night w/o relief.  Mult sick contacts.    Meds, vitals, and allergies reviewed.   ROS: See HPI.  Otherwise, noncontributory.  GEN: nad, alert and oriented HEENT: mucous membranes moist, tm w/o erythema, nasal exam w/o erythema, clear discharge noted,  OP with cobblestoning, sinuses ttp x4 NECK: supple w/o LA CV: rrr.   PULM: ctab, no inc wob EXT: no edema SKIN: no acute rash

## 2011-07-20 NOTE — Assessment & Plan Note (Signed)
Given his other meds, start zmax and use cough syrup prn.  Hold immunologics unless he is much improved this upcoming week. Nontoxic, he agrees. F/u prn.

## 2011-07-22 ENCOUNTER — Other Ambulatory Visit: Payer: Self-pay

## 2011-07-22 MED ORDER — HYDROCODONE-HOMATROPINE 5-1.5 MG/5ML PO SYRP: ORAL_SOLUTION | ORAL | Status: DC

## 2011-07-22 NOTE — Telephone Encounter (Signed)
Tried to phone the patient.  There is only one phone number listed for either he or his wife and VM picks up.

## 2011-07-22 NOTE — Telephone Encounter (Signed)
See if this was an automatic refill.  If it wasn't patient requested and he doesn't need it, then deny it (I printed and gave it to him on the 19th).  If he really needs some more, please call in as above.  Thanks.

## 2011-07-22 NOTE — Telephone Encounter (Signed)
Received fax refill request from patients pharmacy.  Okay to refill? 

## 2011-07-22 NOTE — Telephone Encounter (Signed)
Was able to reach wife.  She says she thinks he is out of the cough syrup.  Refill was phoned to pharmacy as stated below.

## 2012-01-20 ENCOUNTER — Other Ambulatory Visit: Payer: Self-pay | Admitting: *Deleted

## 2012-01-20 MED ORDER — LISINOPRIL 20 MG PO TABS: 20.0000 mg | ORAL_TABLET | Freq: Every day | ORAL | Status: DC

## 2012-06-29 ENCOUNTER — Ambulatory Visit (INDEPENDENT_AMBULATORY_CARE_PROVIDER_SITE_OTHER): Payer: BC Managed Care – PPO | Admitting: Family Medicine

## 2012-06-29 ENCOUNTER — Encounter: Payer: Self-pay | Admitting: Family Medicine

## 2012-06-29 VITALS — BP 116/76 | HR 90 | Temp 98.6°F | Wt 292.0 lb

## 2012-06-29 DIAGNOSIS — J329 Chronic sinusitis, unspecified: Secondary | ICD-10-CM

## 2012-06-29 MED ORDER — AMOXICILLIN-POT CLAVULANATE 875-125 MG PO TABS: 1.0000 | ORAL_TABLET | Freq: Two times a day (BID) | ORAL | Status: DC

## 2012-06-29 MED ORDER — HYDROCODONE-HOMATROPINE 5-1.5 MG/5ML PO SYRP: ORAL_SOLUTION | ORAL | Status: DC

## 2012-06-29 NOTE — Progress Notes (Signed)
Last took MTX 6 days ago.  Last enbrel done also 6 days ago.  Started getting sick 9 days ago.  Started with head congestion, then rhinorrhea.  ST since then.  No fevers.  Sneezing more than normal.  No ear pain.  +B facial pain.  No sputum.  Dry cough.  Cough is worse at night.  Taking robitussin and cough drops at night.  Taking pseudophed at night for the facial congestion.  No diarrhea, no vomiting.    GEN: nad, alert and oriented HEENT: mucous membranes moist, tm w/o erythema, nasal exam w/o erythema, clear discharge noted,  OP with cobblestoning, Sinuses ttp x4 NECK: supple w/o LA CV: rrr.   PULM: ctab, no inc wob EXT: no edema SKIN: no acute rash

## 2012-06-29 NOTE — Patient Instructions (Addendum)
Skip the next dose of enbrel and methotrexate.   Start the antibiotics and use the cough medicine as needed.  Try to use nasal saline.   Try to get some rest and drink plenty of fluids.

## 2012-07-01 DIAGNOSIS — J329 Chronic sinusitis, unspecified: Secondary | ICD-10-CM | POA: Insufficient documentation

## 2012-07-01 DIAGNOSIS — J019 Acute sinusitis, unspecified: Secondary | ICD-10-CM | POA: Insufficient documentation

## 2012-07-01 NOTE — Assessment & Plan Note (Signed)
Nontoxic, start augmentin, hold rheum meds for next dose and f/u prn.  He agrees.  Supportive tx o/w, use cough syrup as needed with sedation caution.   He'll f/u for labs re: BP later this year.  He's had some occ muscle cramps in spite of K supplementation; he'll inc fluids and stretching in the meantime.

## 2012-07-06 ENCOUNTER — Other Ambulatory Visit: Payer: Self-pay

## 2012-07-06 MED ORDER — HYDROCODONE-HOMATROPINE 5-1.5 MG/5ML PO SYRP: ORAL_SOLUTION | ORAL | Status: DC

## 2012-07-06 NOTE — Telephone Encounter (Signed)
pts wife left v/m; pt seen 06/29/12; pt still coughing and congested. Pt still taking antibiotic but needs refill on cough med to [facility] pharmacy.Please advise. Clydie Braun request call back when called in.

## 2012-07-06 NOTE — Telephone Encounter (Signed)
Medication phoned to pharmacy.  

## 2012-07-06 NOTE — Telephone Encounter (Signed)
Please call in.  Thanks.   

## 2012-07-26 ENCOUNTER — Other Ambulatory Visit: Payer: Self-pay | Admitting: *Deleted

## 2012-07-26 MED ORDER — HYDROCODONE-HOMATROPINE 5-1.5 MG/5ML PO SYRP: ORAL_SOLUTION | ORAL | Status: DC

## 2012-07-26 NOTE — Telephone Encounter (Signed)
Please call in.  If not improving, then he needs OV.  Thanks.

## 2012-07-26 NOTE — Telephone Encounter (Signed)
Ok to refill or does pt need ov?

## 2012-07-27 NOTE — Telephone Encounter (Signed)
Medication phoned to pharmacy.  

## 2012-08-10 ENCOUNTER — Ambulatory Visit (INDEPENDENT_AMBULATORY_CARE_PROVIDER_SITE_OTHER): Payer: BC Managed Care – PPO | Admitting: Family Medicine

## 2012-08-10 ENCOUNTER — Encounter: Payer: Self-pay | Admitting: Family Medicine

## 2012-08-10 VITALS — BP 130/80 | HR 71 | Temp 98.4°F

## 2012-08-10 DIAGNOSIS — L4050 Arthropathic psoriasis, unspecified: Secondary | ICD-10-CM

## 2012-08-10 MED ORDER — PREDNISONE 20 MG PO TABS: ORAL_TABLET | ORAL | Status: DC

## 2012-08-10 NOTE — Assessment & Plan Note (Signed)
Worse over the last 4 days.  Likely flare.  Compliant with regular meds. Add on pred taper with steroid cautions. He agrees.  He'll notify rheum clinic Monday.  F/u prn.  Nontoxic. Okay for outpatient f/u.

## 2012-08-10 NOTE — Progress Notes (Signed)
Known psoriatic arthritis and h/o gout.  B hand swelling, R>L.  Home from work today due to pain.  No trauma.  This doesn't feel like prev gout episodes.  Pain and swelling esp in R 2nd-5th MCPs.  Pain writing, holding a pen.  Also with R shoulder but not R elbow pain.  No FCNAV. No rash.  No foot sx.    Meds, vitals, and allergies reviewed.   ROS: See HPI.  Otherwise, noncontributory.  nad ncat Uncomfortable from hand pain Normal ROM at the R elbow Pain with ROM at the R wrist and pain making an fist or extending R hand 2-5 fingers.  Puffy at R>L MCPs with mild erythema Distally nv intact Normal radial pulses

## 2012-08-10 NOTE — Patient Instructions (Addendum)
Start the prednisone today, take it with food.  Take care.  Call the arthritis clinic Monday with an update and call here if needed.  Take care.

## 2013-01-23 ENCOUNTER — Other Ambulatory Visit: Payer: Self-pay | Admitting: Family Medicine

## 2013-04-29 ENCOUNTER — Other Ambulatory Visit: Payer: Self-pay | Admitting: Family Medicine

## 2013-07-12 ENCOUNTER — Ambulatory Visit (INDEPENDENT_AMBULATORY_CARE_PROVIDER_SITE_OTHER): Payer: BC Managed Care – PPO | Admitting: Family Medicine

## 2013-07-12 ENCOUNTER — Encounter: Payer: Self-pay | Admitting: Family Medicine

## 2013-07-12 VITALS — BP 130/90 | HR 71 | Temp 97.5°F | Wt 299.2 lb

## 2013-07-12 DIAGNOSIS — M25579 Pain in unspecified ankle and joints of unspecified foot: Secondary | ICD-10-CM

## 2013-07-12 DIAGNOSIS — N509 Disorder of male genital organs, unspecified: Secondary | ICD-10-CM

## 2013-07-12 DIAGNOSIS — N5082 Scrotal pain: Secondary | ICD-10-CM

## 2013-07-12 DIAGNOSIS — R319 Hematuria, unspecified: Secondary | ICD-10-CM

## 2013-07-12 LAB — URINALYSIS, MICROSCOPIC ONLY
RBC / HPF: NONE SEEN (ref 0–?)
WBC, UA: NONE SEEN (ref 0–?)

## 2013-07-12 LAB — POCT URINALYSIS DIPSTICK
BILIRUBIN UA: NEGATIVE
GLUCOSE UA: NEGATIVE
Ketones, UA: NEGATIVE
LEUKOCYTES UA: NEGATIVE
NITRITE UA: NEGATIVE
PH UA: 6
Protein, UA: NEGATIVE
Spec Grav, UA: 1.02
UROBILINOGEN UA: NEGATIVE

## 2013-07-12 NOTE — Progress Notes (Signed)
Pre-visit discussion using our clinic review tool. No additional management support is needed unless otherwise documented below in the visit note.  L foot pain.  Taking ibuprofen without much help.  Going for about 1 month.  Pain can radiate up the lower leg. Constant pain, but worse sometimes than others. Getting off his foot- still with pain.  First step in AM really hurts.  No injury known.  No audible pop or snap.  No R foot pain. Pain at L plantar fascia.  Feels some better in work boots, better than tennis shoes.    Scrotal pain.  R >L side.  For about 1 month.  No trauma.  No FCNAV, no discharge.  No pain today before exam.   Meds, vitals, and allergies reviewed.   ROS: See HPI.  Otherwise, noncontributory.  nad ncat Testes bilaterally descended without nodularity, tenderness or masses except for possible mass on the L epididymis, slightly tender there. No scrotal masses or lesions o/w. No penis lesions or urethral discharge. No hernia.  L foot with normal inspection.  Tender along the posterior tibial tendon and tender on the plantar fascia.  Not focally ttp on a bony prominence. Mortise intact.

## 2013-07-12 NOTE — Patient Instructions (Signed)
Stretch your foot before you get up each time.  Use the lace up ankle brace for your ankle.  Get soft replacement inserts with arch support.  Notify me if not better.   [name] will call about your referral. Take care.

## 2013-07-13 LAB — URINE CULTURE
COLONY COUNT: NO GROWTH
Organism ID, Bacteria: NO GROWTH

## 2013-07-14 DIAGNOSIS — N5082 Scrotal pain: Secondary | ICD-10-CM | POA: Insufficient documentation

## 2013-07-14 DIAGNOSIS — M25579 Pain in unspecified ankle and joints of unspecified foot: Secondary | ICD-10-CM | POA: Insufficient documentation

## 2013-07-14 NOTE — Assessment & Plan Note (Signed)
Likely plantar fasciitis and posterior tibial tendonitis.  Would use an ankle brace (he has a lace up) for the ankle pain and better arch support with stretching for the plantar pain.  He agrees. Call back as needed.

## 2013-07-14 NOTE — Assessment & Plan Note (Signed)
Refer for nonurgent u/s, see notes on labs.

## 2013-07-16 ENCOUNTER — Other Ambulatory Visit: Payer: BC Managed Care – PPO

## 2013-07-22 ENCOUNTER — Other Ambulatory Visit: Payer: BC Managed Care – PPO

## 2013-07-22 ENCOUNTER — Ambulatory Visit
Admission: RE | Admit: 2013-07-22 | Discharge: 2013-07-22 | Disposition: A | Discharge status: Home / Self Care | Payer: BC Managed Care – PPO | Source: Ambulatory Visit | Attending: Family Medicine | Admitting: Family Medicine

## 2013-07-22 DIAGNOSIS — N5082 Scrotal pain: Secondary | ICD-10-CM

## 2013-07-23 ENCOUNTER — Other Ambulatory Visit: Payer: Self-pay | Admitting: Family Medicine

## 2013-07-23 DIAGNOSIS — N5082 Scrotal pain: Secondary | ICD-10-CM

## 2013-07-29 ENCOUNTER — Other Ambulatory Visit: Payer: Self-pay | Admitting: Family Medicine

## 2014-01-09 ENCOUNTER — Encounter: Payer: Self-pay | Admitting: Family Medicine

## 2014-01-09 ENCOUNTER — Ambulatory Visit (INDEPENDENT_AMBULATORY_CARE_PROVIDER_SITE_OTHER)
Admission: RE | Admit: 2014-01-09 | Discharge: 2014-01-09 | Disposition: A | Discharge status: Home / Self Care | Payer: BC Managed Care – PPO | Source: Ambulatory Visit | Attending: Family Medicine | Admitting: Family Medicine

## 2014-01-09 ENCOUNTER — Encounter (INDEPENDENT_AMBULATORY_CARE_PROVIDER_SITE_OTHER): Payer: Self-pay

## 2014-01-09 ENCOUNTER — Ambulatory Visit (INDEPENDENT_AMBULATORY_CARE_PROVIDER_SITE_OTHER): Payer: BC Managed Care – PPO | Admitting: Family Medicine

## 2014-01-09 VITALS — BP 132/74 | HR 75 | Temp 98.2°F | Wt 299.0 lb

## 2014-01-09 DIAGNOSIS — M25519 Pain in unspecified shoulder: Secondary | ICD-10-CM | POA: Insufficient documentation

## 2014-01-09 DIAGNOSIS — M25512 Pain in left shoulder: Secondary | ICD-10-CM

## 2014-01-09 MED ORDER — HYDROCODONE-ACETAMINOPHEN 5-325 MG PO TABS: 1.0000 | ORAL_TABLET | Freq: Four times a day (QID) | ORAL | Status: DC | PRN

## 2014-01-09 MED ORDER — PREDNISONE 20 MG PO TABS
ORAL_TABLET | ORAL | Status: DC
Start: 2014-01-09 — End: 2014-02-27

## 2014-01-09 NOTE — Assessment & Plan Note (Signed)
Looks to have AC pain, cuff pathology and possible radicular pain.  D/w pt.  Shoulder films neg.  I thought he may have mild AC OA but rady noted not sig abnormality.  At this point, hold embrel and start pred taper.  Limit lifting and he'll update me.  Hydrocodone for pain.  Depending on his response, we may have to w/u further, ie if AC pain continues, if radicular pain continues. D/w pt.  No weakness.  Okay for outpatient f/u.

## 2014-01-09 NOTE — Patient Instructions (Signed)
Take the prednisone with food and use the hydrocodone as needed.   Skip the enbrel this weekend.  Update me next week.  Take care.

## 2014-01-09 NOTE — Progress Notes (Signed)
Pre visit review using our clinic review tool, if applicable. No additional management support is needed unless otherwise documented below in the visit note.  L shoulder pain.  H/o flares.  This flare started about 2 weeks ago.  No trauma, had been at work when the pain started. No snap, no pop, no clear trigger.  Pain at superior L shoulder down the arm, like a lightning bolt down the arm. Unable to rest.  Pain can radiate from the neck.  No R arm sx.  No true L arm weakness but pain limits his strength.  On enbrel at baseline, next dose due on 01/11/14.   Meds, vitals, and allergies reviewed.   ROS: See HPI.  Otherwise, noncontributory.  nad but uncomfortable.  OP mmm Neck supple, no LA, no midline posterior pain.  Normal ROM of neck but pain on ROM L shoulder with pain on AC palpation and testing L shoulder with pain on int/ext rotation, improved some with scapular manipulation No arm drop but + impingement and supraspinatus testing Normal grip but pain on grip.  Normal ROM L elbow No rash, no bruising Normal radial pulse L arm

## 2014-01-28 ENCOUNTER — Other Ambulatory Visit: Payer: Self-pay | Admitting: Family Medicine

## 2014-01-28 NOTE — Telephone Encounter (Signed)
[facility] request refill lisinopril;refilled x1 with note pt needs to schedule appt.

## 2014-02-27 ENCOUNTER — Ambulatory Visit: Payer: BC Managed Care – PPO | Admitting: Family Medicine

## 2014-02-27 ENCOUNTER — Encounter: Payer: Self-pay | Admitting: Family Medicine

## 2014-02-27 ENCOUNTER — Ambulatory Visit (INDEPENDENT_AMBULATORY_CARE_PROVIDER_SITE_OTHER): Payer: BC Managed Care – PPO | Admitting: Family Medicine

## 2014-02-27 VITALS — BP 120/70 | HR 73 | Temp 98.1°F | Ht >= 80 in | Wt 295.8 lb

## 2014-02-27 DIAGNOSIS — M25512 Pain in left shoulder: Secondary | ICD-10-CM

## 2014-02-27 DIAGNOSIS — M7502 Adhesive capsulitis of left shoulder: Secondary | ICD-10-CM

## 2014-02-27 MED ORDER — METHYLPREDNISOLONE ACETATE 40 MG/ML IJ SUSP
80.0000 mg | Freq: Once | INTRAMUSCULAR | Status: AC
  Administered 2014-02-27: 80 mg via INTRA_ARTICULAR

## 2014-02-27 NOTE — Progress Notes (Signed)
Pre visit review using our clinic review tool, if applicable. No additional management support is needed unless otherwise documented below in the visit note. 

## 2014-02-27 NOTE — Progress Notes (Signed)
Dr. Karleen Hampshire T. Katarina Riebe, MD, CAQ Sports [facility] [address] [place], [address] Phone: [phone] Fax: [phone]  02/27/2014  Patient: Mike Foster, MRN: [id number], DOB: Nov 16, 1965, 48 y.o.  Primary Physician:  Crawford Givens, MD  Chief Complaint: Shoulder Pain  Subjective:   Mike Foster is a 48 y.o. very pleasant male patient with psoriatic arthritis on MTX and Enbrel who presents with the following: shoulder pain  The patient noted above presents with LEFT shoulder pain that has been ongoing for 2 months there is no history of trauma or accident. The patient denies neck pain or radicular symptoms. No shoulder blade pain Denies dislocation, subluxation, separation of the shoulder. The patient does complain of pain with flexion, abduction, and terminal motion.  Significant restriction of motion. he describes a deep ache around the shoulder, and sometimes it will wake the patient up at night.  Sees Dr. Kellie Simmering.   Overhead, IROM. Feels weak.   Medications Tried: Vicodin, NSAIDS, prednisone Ice or Heat: hot shower feels good Tried PT: No  Prior shoulder Injury: No Prior surgery: No Prior fracture: No  Past Medical History, Surgical History, Social History, Family History, Medications, and allergies reviewed and updated if relevant.   GEN: No fevers, chills. Nontoxic. Primarily MSK c/o today. MSK: Detailed in the HPI GI: tolerating PO intake without difficulty Neuro: No numbness, parasthesias, or tingling associated. Otherwise the pertinent positives of the ROS are noted above.    Objective:   Blood pressure 120/70, pulse 73, temperature 98.1 F (36.7 C), temperature source Oral, height 6\' 8"  (2.032 m), weight 295 lb 12 oz (134.151 kg).  GEN: Well-developed,well-nourished,in no acute distress; alert,appropriate and cooperative throughout examination HEENT: Normocephalic and atraumatic without obvious abnormalities. Ears,  externally no deformities PULM: Breathing comfortably in no respiratory distress EXT: No clubbing, cyanosis, or edema PSYCH: Normally interactive. Cooperative during the interview. Pleasant. Friendly and conversant. Not anxious or depressed appearing. Normal, full affect.  Shoulder: LEFT Inspection: No muscle wasting or winging Ecchymosis/edema: neg  AC joint, scapula, clavicle: NT Cervical spine: NT, full ROM Spurling's: neg Abduction: 5/5, 125 Flexion: 5/5, 120 IR, full, lift-off: 4+/5, none ER at neutral: 5/5, loss 20 deg AC crossover and compression: unable to complete Additional special testing is equivocal given lack of motion C5-T1 intact Sensation intact Grip 5/5  LEFT SHOULDER - 2+ VIEW  COMPARISON: None.  FINDINGS:  Three views of left shoulder submitted. No acute fracture or  subluxation. No radiopaque foreign body.  IMPRESSION:  Negative.  Electronically Signed  By: Natasha Mead M.D.  On: 01/09/2014 16:24  The radiological images were independently reviewed by myself in the office and results were reviewed with the patient. My independent interpretation of images:  No GH OA and no significant AC OA. No fractures or dislocation. Essentially normal shoulder. Electronically Signed  By: Hannah Beat, MD On: 02/28/2014 10:08 AM   Assessment and Plan:   Adhesive capsulitis, left  Left shoulder pain - Plan: methylPREDNISolone acetate (DEPO-MEDROL) injection 80 mg  >25 minutes spent in face to face time with patient, >50% spent in counselling or coordination of care  Patient was given a systematic ROM protocol from [employer] to be done daily. Emphasized importance of adherence, daily HEP.  The average length of total symptoms is 12 months going through 3 different phases in the freezing and thawing process. Reviewed all with patient.   Tylenol or NSAID of choice prn for pain relief Intraarticular shoulder injections discussed with patient,  which have good evidence for  accelerating the thawing phase.  Ideally, I like to send frozen shoulder patients for PT, but he will not be able to do that, and in a motivated patient, active and strong, I think he can rehab at home.  Intrarticular Shoulder Injection, LEFT Verbal consent was obtained from the patient. Risks including infection explained and contrasted with benefits and alternatives. Patient prepped with Chloraprep and Ethyl Chloride used for anesthesia. An intraarticular shoulder injection was performed using the posterior approach. The patient tolerated the procedure well and had decreased pain post injection. No complications. Injection: 8 cc of Lidocaine 1% and Depo-Medrol 80 mg. Needle: 22 gauge   Follow-up: Return in about 2 months (around 04/29/2014).  Signed,  Elpidio GaleaSpencer T. Ellar Hakala, MD   Patient's Medications  New Prescriptions   No medications on file  Previous Medications   CETIRIZINE (ZYRTEC) 10 MG TABLET    Take 10 mg by mouth daily.     ETANERCEPT (ENBREL) 50 MG/ML INJECTION    Inject 50 mg into the skin once a week.     FOLIC ACID (FOLVITE) 1 MG TABLET    Take 1 mg by mouth daily.   HYDROCODONE-ACETAMINOPHEN (NORCO/VICODIN) 5-325 MG PER TABLET    Take 1 tablet by mouth every 6 (six) hours as needed for moderate pain.   LISINOPRIL (PRINIVIL,ZESTRIL) 20 MG TABLET    TAKE 1 TABLET BY MOUTH DAILY   METHOTREXATE (RHEUMATREX) 2.5 MG TABLET    3 tabs on Saturday and 3 on Sunday  Modified Medications   No medications on file  Discontinued Medications   PREDNISONE (DELTASONE) 20 MG TABLET    3 tabs a day x2 days, then 2 tabs a day x2 days, then 1 tabs a day x2 days, then 0.5 tabs a day x2 days. With food.

## 2014-04-23 ENCOUNTER — Other Ambulatory Visit: Payer: Self-pay | Admitting: Family Medicine

## 2014-04-23 NOTE — Telephone Encounter (Signed)
Pt was told on last refill that he needs OV, will deny until appt is made

## 2014-04-29 ENCOUNTER — Telehealth: Payer: Self-pay | Admitting: Family Medicine

## 2014-04-29 MED ORDER — LISINOPRIL 20 MG PO TABS: 20.0000 mg | ORAL_TABLET | Freq: Every day | ORAL | Status: DC

## 2014-04-29 NOTE — Telephone Encounter (Signed)
Sent. Thanks.   

## 2014-04-29 NOTE — Telephone Encounter (Signed)
Pt's spouse called to schedule rx refill appointment.  Pt completely out of lisinopril (PRINIVIL,ZESTRIL) 20 MG tablet.  Please refill enough until appointment on 05/07/14.    [facility] of choice, [facility] number to call pt is [phone]

## 2014-05-06 ENCOUNTER — Telehealth: Payer: Self-pay | Admitting: *Deleted

## 2014-05-06 NOTE — Telephone Encounter (Signed)
Patient's wife called in to cancel appt and refused to reschedule.  Apparently medication refill was done with agreement to schedule appt.

## 2014-05-07 ENCOUNTER — Ambulatory Visit: Payer: BC Managed Care – PPO | Admitting: Family Medicine

## 2014-05-07 NOTE — Telephone Encounter (Signed)
Noted  

## 2014-06-25 ENCOUNTER — Other Ambulatory Visit: Payer: Self-pay | Admitting: Neurosurgery

## 2014-06-25 DIAGNOSIS — M5416 Radiculopathy, lumbar region: Secondary | ICD-10-CM

## 2014-07-16 ENCOUNTER — Ambulatory Visit
Admission: RE | Admit: 2014-07-16 | Discharge: 2014-07-16 | Disposition: A | Discharge status: Home / Self Care | Payer: BLUE CROSS/BLUE SHIELD | Source: Ambulatory Visit | Attending: Neurosurgery | Admitting: Neurosurgery

## 2014-07-16 DIAGNOSIS — M5416 Radiculopathy, lumbar region: Secondary | ICD-10-CM

## 2014-07-30 ENCOUNTER — Other Ambulatory Visit: Payer: Self-pay | Admitting: Family Medicine

## 2014-07-31 ENCOUNTER — Other Ambulatory Visit: Payer: Self-pay | Admitting: Family Medicine

## 2014-09-16 ENCOUNTER — Encounter: Payer: BLUE CROSS/BLUE SHIELD | Admitting: Family Medicine

## 2014-10-17 ENCOUNTER — Encounter: Payer: Self-pay | Admitting: Family Medicine

## 2014-10-17 ENCOUNTER — Ambulatory Visit (INDEPENDENT_AMBULATORY_CARE_PROVIDER_SITE_OTHER)
Admission: RE | Admit: 2014-10-17 | Discharge: 2014-10-17 | Disposition: A | Discharge status: Home / Self Care | Payer: BLUE CROSS/BLUE SHIELD | Source: Ambulatory Visit | Attending: Family Medicine | Admitting: Family Medicine

## 2014-10-17 ENCOUNTER — Ambulatory Visit (INDEPENDENT_AMBULATORY_CARE_PROVIDER_SITE_OTHER): Payer: BLUE CROSS/BLUE SHIELD | Admitting: Family Medicine

## 2014-10-17 VITALS — BP 126/80 | HR 68 | Temp 98.5°F | Ht 76.75 in | Wt 287.5 lb

## 2014-10-17 DIAGNOSIS — M79645 Pain in left finger(s): Secondary | ICD-10-CM

## 2014-10-17 DIAGNOSIS — M792 Neuralgia and neuritis, unspecified: Secondary | ICD-10-CM

## 2014-10-17 DIAGNOSIS — Z1211 Encounter for screening for malignant neoplasm of colon: Secondary | ICD-10-CM

## 2014-10-17 DIAGNOSIS — L4050 Arthropathic psoriasis, unspecified: Secondary | ICD-10-CM

## 2014-10-17 DIAGNOSIS — I10 Essential (primary) hypertension: Secondary | ICD-10-CM | POA: Diagnosis not present

## 2014-10-17 DIAGNOSIS — Z79631 Long term (current) use of antimetabolite agent: Secondary | ICD-10-CM

## 2014-10-17 DIAGNOSIS — S62639A Displaced fracture of distal phalanx of unspecified finger, initial encounter for closed fracture: Secondary | ICD-10-CM

## 2014-10-17 DIAGNOSIS — Z79899 Other long term (current) drug therapy: Secondary | ICD-10-CM | POA: Diagnosis not present

## 2014-10-17 DIAGNOSIS — Z7189 Other specified counseling: Secondary | ICD-10-CM

## 2014-10-17 DIAGNOSIS — Z0000 Encounter for general adult medical examination without abnormal findings: Secondary | ICD-10-CM

## 2014-10-17 LAB — COMPREHENSIVE METABOLIC PANEL
ALK PHOS: 60 U/L (ref 39–117)
ALT: 33 U/L (ref 0–53)
AST: 24 U/L (ref 0–37)
Albumin: 4.5 g/dL (ref 3.5–5.2)
BILIRUBIN TOTAL: 0.9 mg/dL (ref 0.2–1.2)
BUN: 19 mg/dL (ref 6–23)
CHLORIDE: 105 meq/L (ref 96–112)
CO2: 26 meq/L (ref 19–32)
CREATININE: 1.14 mg/dL (ref 0.40–1.50)
Calcium: 9.7 mg/dL (ref 8.4–10.5)
GFR: 72.17 mL/min (ref >60.00)
Glucose, Bld: 99 mg/dL (ref 70–99)
Potassium: 4.2 mEq/L (ref 3.5–5.1)
Sodium: 137 mEq/L (ref 135–145)
Total Protein: 7.3 g/dL (ref 6.0–8.3)

## 2014-10-17 LAB — CBC WITH DIFFERENTIAL/PLATELET
BASOS ABS: 0 10*3/uL (ref 0.0–0.1)
Basophils Relative: 0.6 % (ref 0.0–3.0)
Eosinophils Absolute: 0.1 10*3/uL (ref 0.0–0.7)
Eosinophils Relative: 1.7 % (ref 0.0–5.0)
HCT: 46.4 % (ref 39.0–52.0)
HEMOGLOBIN: 16.5 g/dL (ref 13.0–17.0)
LYMPHS PCT: 23.7 % (ref 12.0–46.0)
Lymphs Abs: 1.9 10*3/uL (ref 0.7–4.0)
MCHC: 35.5 g/dL (ref 30.0–36.0)
MCV: 94.8 fl (ref 78.0–100.0)
MONOS PCT: 9.3 % (ref 3.0–12.0)
Monocytes Absolute: 0.8 10*3/uL (ref 0.1–1.0)
Neutro Abs: 5.3 10*3/uL (ref 1.4–7.7)
Neutrophils Relative %: 64.7 % (ref 43.0–77.0)
PLATELETS: 177 10*3/uL (ref 150.0–400.0)
RBC: 4.9 Mil/uL (ref 4.22–5.81)
RDW: 13.9 % (ref 11.5–15.5)
WBC: 8.1 10*3/uL (ref 4.0–10.5)

## 2014-10-17 LAB — LIPID PANEL
CHOLESTEROL: 152 mg/dL (ref 0–200)
HDL: 50.2 mg/dL (ref >39.00)
LDL Cholesterol: 79 mg/dL (ref 0–99)
NonHDL: 101.8
TRIGLYCERIDES: 114 mg/dL (ref 0.0–149.0)
Total CHOL/HDL Ratio: 3
VLDL: 22.8 mg/dL (ref 0.0–40.0)

## 2014-10-17 MED ORDER — LISINOPRIL 20 MG PO TABS: 20.0000 mg | ORAL_TABLET | Freq: Every day | ORAL | Status: DC

## 2014-10-17 MED ORDER — GABAPENTIN 300 MG PO CAPS: ORAL_CAPSULE | ORAL | Status: DC

## 2014-10-17 NOTE — Patient Instructions (Addendum)
Ask Dr. Kellie Simmeringruslow about getting the pneumonia vaccine.   Go to the lab on the way out.  We'll contact you with your lab and xray report. Use gabapentin for now for the pain and call the spine clinic.  Take care.  Glad to see you.

## 2014-10-17 NOTE — Progress Notes (Signed)
Pre visit review using our clinic review tool, if applicable. No additional management support is needed unless otherwise documented below in the visit note.  CPE- See plan.  Routine anticipatory guidance given to patient.  See health maintenance. Tetanus 2008 Flu encouraged.  PNA d/w pt.   Shingles NA D/w patient NW:[id number]:options for colon cancer screening, including IFOB vs. colonoscopy.  Risks and benefits of both were discussed and patient voiced understanding.  Pt elects for: IFOB.   Prostate cancer screening and PSA options (with potential risks and benefits of testing vs not testing) were discussed along with recent recs/guidelines.  He declined testing PSA at this point. Diet and exercise d/w pt.  Encouraged both.  Exercise mainly at work. Intentional weight loss noted.  Living will d/w pt.  Wife designated if patient were incapacitated.    Hypertension:    Using medication without problems or lightheadedness: yes Chest pain with exertion:no Edema:no Short of breath:no Average home BPs: not checked.     L shoulder pain.  Pain along the L trap.  Pain with neck ROM, tilting to the L side.  He can get some spasm along the L arm when the pain is worse.  He can get numbness in the L arm occ.  Massage helps the neck pain.  He has h/o neck surgery in the past.    Injured L 5th finger yesterday.  It was mashed in a stack of fittings.  Pain with ROM.  No other new finger injury.    PMH and SH reviewed  Meds, vitals, and allergies reviewed.   ROS: See HPI.  Otherwise negative.    GEN: nad, alert and oriented HEENT: mucous membranes moist NECK: supple w/o LA but pain with ROM.  CV: rrr. PULM: ctab, no inc wob ABD: soft, +bs EXT: no edema SKIN: no acute rash Normal L shoulder ROM S/S wnl for the BUE L 5th DIP ttp, sensation and cap refill wnl.

## 2014-10-19 ENCOUNTER — Telehealth: Payer: Self-pay | Admitting: Family Medicine

## 2014-10-19 NOTE — Telephone Encounter (Signed)
Please call pt.  I talked with Dr. Patsy Lageropland in the meantime.  Stay in the splint 100% of the time and get him an appointment to see Dr. Patsy Lageropland.  He should do fine, but I want him to see [place].  Thanks.

## 2014-10-20 ENCOUNTER — Encounter: Payer: Self-pay | Admitting: Family Medicine

## 2014-10-20 ENCOUNTER — Ambulatory Visit (INDEPENDENT_AMBULATORY_CARE_PROVIDER_SITE_OTHER): Payer: BLUE CROSS/BLUE SHIELD | Admitting: Family Medicine

## 2014-10-20 VITALS — BP 112/64 | HR 90 | Temp 98.1°F | Ht 76.75 in | Wt 294.2 lb

## 2014-10-20 DIAGNOSIS — S62639A Displaced fracture of distal phalanx of unspecified finger, initial encounter for closed fracture: Secondary | ICD-10-CM

## 2014-10-20 DIAGNOSIS — M792 Neuralgia and neuritis, unspecified: Secondary | ICD-10-CM | POA: Insufficient documentation

## 2014-10-20 DIAGNOSIS — Z0000 Encounter for general adult medical examination without abnormal findings: Secondary | ICD-10-CM | POA: Insufficient documentation

## 2014-10-20 DIAGNOSIS — Z7189 Other specified counseling: Secondary | ICD-10-CM | POA: Insufficient documentation

## 2014-10-20 NOTE — Telephone Encounter (Signed)
Patient notified as instructed by telephone. Patient stated that he already has an appointment scheduled with Dr. Patsy Lageropland today.

## 2014-10-20 NOTE — Assessment & Plan Note (Signed)
Per rheum 

## 2014-10-20 NOTE — Progress Notes (Signed)
Dr. Karleen HampshireSpencer T. Moshe Wenger, MD, CAQ Sports Medicine Primary [facility] and Sports Medicine [address] [place], [id number] Phone: [phone] Fax: [phone]  10/20/2014  Patient: Mike Foster, MRN: [id number], DOB: [birth year], 49 y.o.  Primary Physician:  Crawford GivensGraham Kimple, MD  Chief Complaint: Follow-up  Subjective:   Mike Foster is a 49 y.o. very pleasant male patient who presents with the following:  Very pleasant gentleman who presents in follow-up of the left distal phalanx fracture.  Per his report, he dropped something on his finger and it became painful and he has some swelling.  He was given a splint last week at the end of the week by my partner.  He asked my opinion over the weekend, and upon reviewing the films, given the fracture location I had concerns for bony mallet finger.  Past Medical History, Surgical History, Social History, Family History, Problem List, Medications, and Allergies have been reviewed and updated if relevant.  GEN: No fevers, chills. Nontoxic. Primarily MSK c/o today. MSK: Detailed in the HPI GI: tolerating PO intake without difficulty Neuro: No numbness, parasthesias, or tingling associated. Otherwise the pertinent positives of the ROS are noted above.   Objective:   BP 112/64 mmHg  Pulse 90  Temp(Src) 98.1 F (36.7 C) (Oral)  Ht 6' 4.75" (1.949 m)  Wt 294 lb 4 oz (133.471 kg)  BMI 35.14 kg/m2   GEN: WDWN, NAD, Non-toxic, Alert & Oriented x 3 HEENT: Atraumatic, Normocephalic.  Ears and Nose: No external deformity. EXTR: No clubbing/cyanosis/edema NEURO: Normal gait.  PSYCH: Normally interactive. Conversant. Not depressed or anxious appearing.  Calm demeanor.    Left hand: Nontender along all the metacarpals and all of the digits one through 4.  Carpals are nontender.  Nontender at the PIP joint on the fifth on the left.  DIP joint is tender to palpation.  There is some swelling and no bruising.  The flexor tendon at the DIP joint  appears fully intact.  I did not aggressively check the extensor mechanism given the fracture, but it appears to be functioning at least in part.  Visually, there is no clear mallet deformity.  Radiology: Dg Finger Little Left  10/17/2014   CLINICAL DATA:  49 year old male with a history of left finger injury.  EXAM: LEFT LITTLE FINGER 2+V  COMPARISON:  None.  FINDINGS: Acute fracture line of the dorsal, proximal aspect of the distal phalanx of the left fifth digit. Soft tissue swelling present. No radiopaque foreign body.  IMPRESSION: Acute fracture of the dorsal, proximal aspect of the distal phalanx left fifth digit.  Signed,  Yvone NeuJaime S. Loreta AveWagner, DO  [facility]  [facility]   Electronically Signed   By: Gilmer MorJaime  Wagner D.O.   On: 10/17/2014 13:40    Assessment and Plan:   Fracture, finger, distal phalanx, closed, initial encounter  With the location of this injury, there is almost always in injury to the extensor mechanism of the DIP, resulting in a extensor tendon disruption causing a mallet finger.  Given anatomical location, I suspected there was some injury to this, but it appears to not be fully disrupted.  I reviewed all this with the patient and his family.  I think that we will be safe immobilizing this for a slightly less time compared to a classical mallet finger.  Given the size the patient's finger, had to custom craft a splint out of a existing stack splint to allow for PIP movement.  This was  done using a [employer] libr[employer].  Excellent fit.  I recommended that he wear this splint essentially at all times for the next one month.  As long as he does okay, and does not develop a mallet deformity, think that he can follow-up on a when necessary basis.  I appreciate the opportunity to evaluate this very friendly patient. If you have any question regarding her care or prognosis, do not hesitate to ask.   Signed,  Elpidio Galea. Aleisha Paone,  MD   Patient's Medications  New Prescriptions   No medications on file  Previous Medications   CETIRIZINE (ZYRTEC) 10 MG TABLET    Take 10 mg by mouth daily.     ETANERCEPT (ENBREL) 50 MG/ML INJECTION    Inject 50 mg into the skin once a week.     FOLIC ACID (FOLVITE) 1 MG TABLET    Take 1 mg by mouth daily.   GABAPENTIN (NEURONTIN) 300 MG CAPSULE    Start with 1 tab at night for pain for 3 days.  Take twice a day if needed thereafter.  Sedation caution.   HYDROCODONE-ACETAMINOPHEN (NORCO/VICODIN) 5-325 MG PER TABLET    Take 1 tablet by mouth every 6 (six) hours as needed for moderate pain.   LISINOPRIL (PRINIVIL,ZESTRIL) 20 MG TABLET    Take 1 tablet (20 mg total) by mouth daily.   METHOTREXATE (RHEUMATREX) 2.5 MG TABLET    3 tabs on Saturday and 3 on Sunday   TIZANIDINE (ZANAFLEX) 4 MG CAPSULE    Take 1 capsule by mouth 3 (three) times daily as needed.  Modified Medications   No medications on file  Discontinued Medications   No medications on file

## 2014-10-20 NOTE — Progress Notes (Signed)
Pre visit review using our clinic review tool, if applicable. No additional management support is needed unless otherwise documented below in the visit note. 

## 2014-10-20 NOTE — Assessment & Plan Note (Signed)
Continue diet and exercise, see notes on labs.  Continue meds as is.  He agrees.

## 2014-10-20 NOTE — Assessment & Plan Note (Signed)
Routine anticipatory guidance given to patient.  See health maintenance. Tetanus 2008 Flu encouraged.  PNA d/w pt.   Shingles NA D/w [name]:options for colon cancer screening, including IFOB vs. colonoscopy.  Risks and benefits of both were discussed and patient voiced understanding.  Pt elects for: IFOB.   Prostate cancer screening and PSA options (with potential risks and benefits of testing vs not testing) were discussed along with recent recs/guidelines.  He declined testing PSA at this point. Diet and exercise d/w pt.  Encouraged both.  Exercise mainly at work. Intentional weight loss noted.  Living will d/w pt.  Wife designated if patient were incapacitated.

## 2014-10-20 NOTE — Assessment & Plan Note (Signed)
Splinted, will have him f/u with Dr. Patsy Lageropland.  He felt better when splinted.

## 2014-10-20 NOTE — Assessment & Plan Note (Signed)
No weakness but with his hx would suspect a radicular source and I want him to d/w his surgery clinic.  He agrees.  Okay to try gabapentin in the meantime with routine cautions. He agrees.

## 2014-10-21 ENCOUNTER — Telehealth: Payer: Self-pay | Admitting: Family Medicine

## 2014-10-21 NOTE — Telephone Encounter (Signed)
I have not tried to call this patient.

## 2014-10-21 NOTE — Telephone Encounter (Signed)
Pt is returning a call, he said he could not answer in time.  Please return call to mobile number thanks.

## 2014-10-21 NOTE — Telephone Encounter (Signed)
Patient returned Regina's call.  Please call patient back at [phone].

## 2014-10-21 NOTE — Telephone Encounter (Signed)
Returned patient's phone call and lab results were given.

## 2014-10-24 ENCOUNTER — Other Ambulatory Visit (INDEPENDENT_AMBULATORY_CARE_PROVIDER_SITE_OTHER): Payer: BLUE CROSS/BLUE SHIELD

## 2014-10-24 DIAGNOSIS — Z1211 Encounter for screening for malignant neoplasm of colon: Secondary | ICD-10-CM

## 2014-10-24 LAB — FECAL OCCULT BLOOD, IMMUNOCHEMICAL: Fecal Occult Bld: NEGATIVE

## 2015-02-26 ENCOUNTER — Other Ambulatory Visit: Payer: Self-pay | Admitting: Family Medicine

## 2015-02-27 NOTE — Telephone Encounter (Signed)
Sent. Thanks.   

## 2015-02-27 NOTE — Telephone Encounter (Signed)
Last filled 10/20/14 with 1 refill--please advise if okay to refill

## 2015-03-02 ENCOUNTER — Other Ambulatory Visit: Payer: Self-pay | Admitting: Rheumatology

## 2015-03-02 ENCOUNTER — Ambulatory Visit
Admission: RE | Admit: 2015-03-02 | Discharge: 2015-03-02 | Disposition: A | Discharge status: Home / Self Care | Payer: BLUE CROSS/BLUE SHIELD | Source: Ambulatory Visit | Attending: Rheumatology | Admitting: Rheumatology

## 2015-03-02 DIAGNOSIS — M542 Cervicalgia: Secondary | ICD-10-CM

## 2015-03-16 ENCOUNTER — Other Ambulatory Visit: Payer: Self-pay | Admitting: Family Medicine

## 2015-03-16 ENCOUNTER — Ambulatory Visit (INDEPENDENT_AMBULATORY_CARE_PROVIDER_SITE_OTHER): Payer: BLUE CROSS/BLUE SHIELD | Admitting: Family Medicine

## 2015-03-16 ENCOUNTER — Encounter: Payer: Self-pay | Admitting: Family Medicine

## 2015-03-16 VITALS — BP 110/66 | HR 84 | Temp 99.2°F | Wt 295.0 lb

## 2015-03-16 DIAGNOSIS — Z23 Encounter for immunization: Secondary | ICD-10-CM | POA: Diagnosis not present

## 2015-03-16 DIAGNOSIS — M25559 Pain in unspecified hip: Secondary | ICD-10-CM | POA: Diagnosis not present

## 2015-03-16 DIAGNOSIS — Z119 Encounter for screening for infectious and parasitic diseases, unspecified: Secondary | ICD-10-CM | POA: Diagnosis not present

## 2015-03-16 DIAGNOSIS — Z79899 Other long term (current) drug therapy: Secondary | ICD-10-CM | POA: Diagnosis not present

## 2015-03-16 DIAGNOSIS — R1030 Lower abdominal pain, unspecified: Secondary | ICD-10-CM | POA: Diagnosis not present

## 2015-03-16 LAB — POCT URINALYSIS DIPSTICK
Bilirubin, UA: NEGATIVE
Blood, UA: NEGATIVE
Glucose, UA: NEGATIVE
Ketones, UA: NEGATIVE
LEUKOCYTES UA: NEGATIVE
NITRITE UA: NEGATIVE
PROTEIN UA: NEGATIVE
UROBILINOGEN UA: 0.2
pH, UA: 5.5

## 2015-03-16 NOTE — Progress Notes (Signed)
Pre visit review using our clinic review tool, if applicable. No additional management support is needed unless otherwise documented below in the visit note.  Groin/rectal pain.  Started about 1 month ago.  Started near the rectum, now with B scrotal pain, persistent ache with  Intermittent flares.  Pain with urination with bearing down.  No lump or mass noted.  No rash, no discharge.  Prev with u/s done 2015:1. No evidence of testicular mass or explanation for left-sided pain. 2. Small right epididymal cyst versus spermatocele.  Wife with HCV, husband needs screening.  D/w pt about HIV and HCV screening.  See notes on labs.    PMH and SH reviewed  ROS: See HPI, otherwise noncontributory.  Meds, vitals, and allergies reviewed.   nad ncat rrr ctab abd soft, not ttp Back w/o CVA pain Ext w/o edema Testes bilaterally descended without nodularity, tenderness or masses. He does have some B tenderness in the inguinal canals but no hernia appreciated on either side. No scrotal masses or lesions. No penis lesions or urethral discharge. Prostate gland firm and smooth, no enlargement, nodularity, tenderness, mass, asymmetry or induration.

## 2015-03-16 NOTE — Patient Instructions (Signed)
Go to the lab on the way out.  We'll contact you with your lab report. If your urine labs are abnormal, then we'll get your treated.  If not, then we'll likely set you up with urology.  Take care.  Glad to see you.

## 2015-03-17 ENCOUNTER — Encounter: Payer: Self-pay | Admitting: Family Medicine

## 2015-03-17 DIAGNOSIS — Z119 Encounter for screening for infectious and parasitic diseases, unspecified: Secondary | ICD-10-CM | POA: Insufficient documentation

## 2015-03-17 DIAGNOSIS — R1030 Lower abdominal pain, unspecified: Secondary | ICD-10-CM | POA: Insufficient documentation

## 2015-03-17 LAB — COMPREHENSIVE METABOLIC PANEL
ALT: 56 U/L — AB (ref 0–53)
AST: 42 U/L — AB (ref 0–37)
Albumin: 4.5 g/dL (ref 3.5–5.2)
Alkaline Phosphatase: 62 U/L (ref 39–117)
BILIRUBIN TOTAL: 0.5 mg/dL (ref 0.2–1.2)
BUN: 21 mg/dL (ref 6–23)
CALCIUM: 9.6 mg/dL (ref 8.4–10.5)
CO2: 26 meq/L (ref 19–32)
CREATININE: 1.26 mg/dL (ref 0.40–1.50)
Chloride: 106 mEq/L (ref 96–112)
GFR: 64.19 mL/min (ref >60.00)
GLUCOSE: 93 mg/dL (ref 70–99)
Potassium: 4.6 mEq/L (ref 3.5–5.1)
Sodium: 139 mEq/L (ref 135–145)
Total Protein: 7.3 g/dL (ref 6.0–8.3)

## 2015-03-17 LAB — URINE CULTURE: Colony Count: 3000

## 2015-03-17 LAB — HIV ANTIBODY (ROUTINE TESTING W REFLEX): HIV 1&2 Ab, 4th Generation: NONREACTIVE

## 2015-03-17 LAB — HEPATITIS C ANTIBODY: HCV Ab: NEGATIVE

## 2015-03-17 NOTE — Assessment & Plan Note (Signed)
He doesn't have sig finding except for B inguinal canal/ring pain, but w/o a hernia that I can feel. D/w pt.  His situation isn't clear.  I didn't know if he could have a chronic prostatitis, but his DRE was unremarkable.  D/w pt.  Would check urine and if unremarkable, would have him see urology. Noted that he had u/s done last year w/o sig structural abnormality.  D/w pt.  He agrees.

## 2015-03-17 NOTE — Assessment & Plan Note (Signed)
See notes on labs. 

## 2015-03-19 ENCOUNTER — Telehealth: Payer: Self-pay | Admitting: Family Medicine

## 2015-03-19 NOTE — Telephone Encounter (Signed)
Patient notified of lab results. Thanks!  

## 2015-03-19 NOTE — Telephone Encounter (Signed)
Pt returned call - please call back at [phone] Thank you

## 2015-04-10 ENCOUNTER — Ambulatory Visit: Payer: Self-pay

## 2015-04-18 ENCOUNTER — Other Ambulatory Visit: Payer: Self-pay | Admitting: Family Medicine

## 2015-04-20 NOTE — Telephone Encounter (Signed)
Electronic refill request. Last Filled:    60 capsule 1 02/27/2015  Last office visit:   October 2016.  Please advise.

## 2015-04-21 NOTE — Telephone Encounter (Signed)
Sent. Thanks.   

## 2015-09-09 ENCOUNTER — Encounter: Payer: Self-pay | Admitting: Family Medicine

## 2015-09-09 ENCOUNTER — Ambulatory Visit (INDEPENDENT_AMBULATORY_CARE_PROVIDER_SITE_OTHER): Payer: 59 | Admitting: Family Medicine

## 2015-09-09 VITALS — BP 136/76 | HR 78 | Temp 98.5°F | Wt 287.0 lb

## 2015-09-09 DIAGNOSIS — Z7189 Other specified counseling: Secondary | ICD-10-CM

## 2015-09-09 DIAGNOSIS — I10 Essential (primary) hypertension: Secondary | ICD-10-CM

## 2015-09-09 DIAGNOSIS — Z634 Disappearance and death of family member: Secondary | ICD-10-CM | POA: Insufficient documentation

## 2015-09-09 DIAGNOSIS — Z1211 Encounter for screening for malignant neoplasm of colon: Secondary | ICD-10-CM | POA: Insufficient documentation

## 2015-09-09 MED ORDER — LISINOPRIL 20 MG PO TABS: 20.0000 mg | ORAL_TABLET | Freq: Every day | ORAL | Status: DC

## 2015-09-09 NOTE — Assessment & Plan Note (Signed)
Condolences offered, he has some family and friends for support.  I mentioned counseling, should he want to go.  Okay for outpatient f/u.  No SI/HI.

## 2015-09-09 NOTE — Assessment & Plan Note (Signed)
D/w patient re:options for colon cancer screening, including IFOB vs. colonoscopy.  Risks and benefits of both were discussed and patient voiced understanding.  Pt elects for:IFOB  

## 2015-09-09 NOTE — Patient Instructions (Signed)
Go to the lab on the way out.  We'll contact you with your lab report. If you get lightheaded, then cut the lisinopril in half and update me.  Keep drinking plenty of fluids.  Take care.  gald to see you.  I will be thinking about you.

## 2015-09-09 NOTE — Progress Notes (Signed)
Pre visit review using our clinic review tool, if applicable. No additional management support is needed unless otherwise documented below in the visit note.  Wife died a few months ago.  I was unaware of this until today.  I offered my condolences.    D/w patient GN:[id number]:options for colon cancer screening, including IFOB vs. colonoscopy.  Risks and benefits of both were discussed and patient voiced understanding.  Pt elects for: IFOB.   Hypertension:    Using medication without problems or lightheadedness: yes Chest pain with exertion:no Edema:no Short of breath:no Due for lipids.    He hit R upper forehead with a lever last week, accidentally, was tightening a belt on a trailer at the time.  No LOC.  Didn't need stiches.  Was sore to the touch initially, better now.    ROS: See HPI, otherwise noncontributory.  Meds, vitals, and allergies reviewed.   GEN: nad, alert and oriented, tearful but regains composure.  Small vertical healed lac on the R upper forehead noted.  Skin is clean appearing, no sign of infection.  No bruising.   (this looks to be incidental/healed at this point and shouldn't need f/u or imaging,d/w pt) HEENT: mucous membranes moist NECK: supple w/o LA CV: rrr.  PULM: ctab, no inc wob ABD: soft, +bs EXT: no edema

## 2015-09-09 NOTE — Assessment & Plan Note (Signed)
Continue current meds.  Weight down during period of grief.  If lightheaded, then he'll cut ACE dose and update me.  Check lipids.

## 2015-09-10 LAB — LIPID PANEL
CHOLESTEROL: 123 mg/dL (ref 0–200)
HDL: 47.5 mg/dL (ref >39.00)
LDL CALC: 60 mg/dL (ref 0–99)
NonHDL: 75.34
TRIGLYCERIDES: 78 mg/dL (ref 0.0–149.0)
Total CHOL/HDL Ratio: 3
VLDL: 15.6 mg/dL (ref 0.0–40.0)

## 2015-09-14 ENCOUNTER — Encounter: Payer: Self-pay | Admitting: *Deleted

## 2015-09-22 ENCOUNTER — Other Ambulatory Visit (INDEPENDENT_AMBULATORY_CARE_PROVIDER_SITE_OTHER): Payer: 59

## 2015-09-22 DIAGNOSIS — Z1211 Encounter for screening for malignant neoplasm of colon: Secondary | ICD-10-CM | POA: Diagnosis not present

## 2015-09-23 LAB — FECAL OCCULT BLOOD, IMMUNOCHEMICAL: Fecal Occult Bld: NEGATIVE

## 2015-09-24 ENCOUNTER — Encounter: Payer: Self-pay | Admitting: *Deleted

## 2015-10-26 ENCOUNTER — Other Ambulatory Visit: Payer: Self-pay | Admitting: Family Medicine

## 2016-02-09 ENCOUNTER — Ambulatory Visit (INDEPENDENT_AMBULATORY_CARE_PROVIDER_SITE_OTHER): Payer: 59 | Admitting: Family Medicine

## 2016-02-09 ENCOUNTER — Encounter: Payer: Self-pay | Admitting: Family Medicine

## 2016-02-09 DIAGNOSIS — R21 Rash and other nonspecific skin eruption: Secondary | ICD-10-CM | POA: Diagnosis not present

## 2016-02-09 MED ORDER — TRIAMCINOLONE ACETONIDE 0.1 % EX CREA: 1.0000 "application " | TOPICAL_CREAM | Freq: Every day | CUTANEOUS | 0 refills | Status: DC

## 2016-02-09 NOTE — Progress Notes (Signed)
Pre visit review using our clinic review tool, if applicable. No additional management support is needed unless otherwise documented below in the visit note. 

## 2016-02-09 NOTE — Progress Notes (Signed)
He is still working through the loss of his wife.  D/w pt.  "I'm taking it day by by."    Rash.  L>R axilla.  Present for about 1 week.  Burning initially with deodorant use.  Not draining.  No fevers.  Irritated, when rubbing on shirt.    Meds, vitals, and allergies reviewed.   ROS: Per HPI unless specifically indicated in ROS section   nad ncat Neck supple, no LA Ring shaped rash on L axilla with central clearing.  Small area with rash on the R side.  No LA in axilla.

## 2016-02-09 NOTE — Patient Instructions (Addendum)
Go back to the original deodorant and use triamcinolone cream once a day after your PM shower.   This should get better.   Update me as needed.

## 2016-02-10 DIAGNOSIS — R21 Rash and other nonspecific skin eruption: Secondary | ICD-10-CM | POA: Insufficient documentation

## 2016-02-10 NOTE — Assessment & Plan Note (Signed)
This looks more like irritation than infection. Discussed with patient. He had changed deodorants recently. Change back to old deodorant for use in the morning. He can put triamcinolone on the irritated area at night. Should resolve. Follow-up when necessary. He agrees.

## 2016-02-19 ENCOUNTER — Ambulatory Visit (INDEPENDENT_AMBULATORY_CARE_PROVIDER_SITE_OTHER): Payer: 59 | Admitting: Family Medicine

## 2016-02-19 ENCOUNTER — Encounter: Payer: Self-pay | Admitting: Family Medicine

## 2016-02-19 DIAGNOSIS — L03818 Cellulitis of other sites: Secondary | ICD-10-CM | POA: Diagnosis not present

## 2016-02-19 MED ORDER — DOXYCYCLINE HYCLATE 100 MG PO TABS: 100.0000 mg | ORAL_TABLET | Freq: Two times a day (BID) | ORAL | 0 refills | Status: DC

## 2016-02-19 MED ORDER — CHLORHEXIDINE GLUCONATE 4 % EX LIQD: CUTANEOUS | 1 refills | Status: DC

## 2016-02-19 NOTE — Patient Instructions (Signed)
Change to dial or antibacterial soap.  Use hibiclens in the shower.  Start doxycycline- sunburn caution.  Stop triamcinolone cream for now.  Warm compresses as often as you can.  Update us as needed.  I'd like to hear from you Monday or Tuesday, one way or the other.  Take care.  Glad to see you.

## 2016-02-19 NOTE — Progress Notes (Signed)
Pre visit review using our clinic review tool, if applicable. No additional management support is needed unless otherwise documented below in the visit note. 

## 2016-02-19 NOTE — Progress Notes (Signed)
TAC cream prev helped with some of the irritation but now with sores "like zits" in the axilla.  Worse in the last few days.  No fevers.  Sore to the touch.    Meds, vitals, and allergies reviewed.   ROS: Per HPI unless specifically indicated in ROS section   nad No clavicular lymphadenopathy. Bilateral axillary exam with rash improved from previous. Still more prominent on left versus right, but both are improved from previous. Now with multiple pustular lesions, left more than right. None are currently draining. His axilla is not tender otherwise. The lesions do not appear to communicate within each axilla.

## 2016-02-20 DIAGNOSIS — L0390 Cellulitis, unspecified: Secondary | ICD-10-CM | POA: Insufficient documentation

## 2016-02-20 NOTE — Assessment & Plan Note (Signed)
Discussed with patient. The previous irritation looks some better. Stop triamcinolone for now. This almost looks more like Hidradenitis suppurativa, but he does not have a history of that before. I think is more likely that he had local irritation, and then that got secondarily infected. Discussed with patient. Start doxycycline with sun cautions. Change over to antibacterial soap, like Dial or similar. He can also use Hibiclens once a day. He can use a small amount in the axilla wash off and then dry. He has never had an allergic reaction to any type of soap or antibacterial before. He'll update me by phone on Monday or Tuesday. Okay for follow-up as an outpatient.

## 2016-03-17 ENCOUNTER — Ambulatory Visit (INDEPENDENT_AMBULATORY_CARE_PROVIDER_SITE_OTHER): Payer: 59 | Admitting: Family Medicine

## 2016-03-17 ENCOUNTER — Encounter: Payer: Self-pay | Admitting: Family Medicine

## 2016-03-17 DIAGNOSIS — L03818 Cellulitis of other sites: Secondary | ICD-10-CM

## 2016-03-17 MED ORDER — DOXYCYCLINE HYCLATE 100 MG PO TABS: 100.0000 mg | ORAL_TABLET | Freq: Two times a day (BID) | ORAL | 0 refills | Status: DC

## 2016-03-17 NOTE — Patient Instructions (Addendum)
Restart doxy.  Keep using dial or similar along with the hibiclens.  Elevated/prop up your forearm.  If the swelling keeps getting worse, if you have fevers, or a dramatic increase in the pain in your forearm, then go to the ER.  Please update me Monday.  Take care.  Glad to see you.  If you are finishing the antibiotics and not fully well, then let me know.

## 2016-03-17 NOTE — Progress Notes (Signed)
Axillary sx got some better with doxy, not fully resolved, then sx got worse again in the meantime.  Sore to the touch, still L>R axilla.  No FCNAVD.  Lesions aren't not draining usually but 1 on the L axilla did last night.    Cellulitis on L forearm.  Prev with a "pimple" that didn't drain. More redness and swelling and soreness in the meantime.  Distal radial pulse wnl.    Using hibiclens.    Meds, vitals, and allergies reviewed.   ROS: Per HPI unless specifically indicated in ROS section   No apparent distress Bilateral axilla with a ring of leftover postinflammatory hyperpigmentation that does not look to be actively infected. He has a few scattered small lesions in axilla that are tender. He has 2 on the left and one on the right. He does not have significant spreading erythema at the lesions. This looks clearly better than it did the last amounts on.  Left arm is noted for cellulitis with warmth redness and tenderness along the left extensor forearm. There does appear to be a central focus of the infection near the proximal extensor side of the forearm, but I do not feel a fluctuant mass that would need incision and drainage. Distally is neurovascularly intact. He has a normal and intact radial pulse. Grip is normal. He does not have circumferential erythema.

## 2016-03-17 NOTE — Assessment & Plan Note (Signed)
Axillary lesions are improved compared to the last visit. Restart doxycycline. Continue using Hibiclens The left forearm issues are new. Doxycycline should help. He does not appear to need incision and drainage. Routine cautions given in case he worsens in the meantime. Doxycycline and elevation should help with this. He will update me as needed. Okay for outpatient follow-up.

## 2016-03-17 NOTE — Progress Notes (Signed)
Pre visit review using our clinic review tool, if applicable. No additional management support is needed unless otherwise documented below in the visit note. 

## 2016-06-27 DIAGNOSIS — L03011 Cellulitis of right finger: Secondary | ICD-10-CM | POA: Diagnosis not present

## 2016-06-27 DIAGNOSIS — L4050 Arthropathic psoriasis, unspecified: Secondary | ICD-10-CM | POA: Diagnosis not present

## 2016-06-27 DIAGNOSIS — M542 Cervicalgia: Secondary | ICD-10-CM | POA: Diagnosis not present

## 2016-07-20 DIAGNOSIS — M5416 Radiculopathy, lumbar region: Secondary | ICD-10-CM | POA: Diagnosis not present

## 2016-07-20 DIAGNOSIS — M545 Low back pain: Secondary | ICD-10-CM | POA: Diagnosis not present

## 2016-07-20 DIAGNOSIS — M5136 Other intervertebral disc degeneration, lumbar region: Secondary | ICD-10-CM | POA: Diagnosis not present

## 2016-07-26 DIAGNOSIS — Z79899 Other long term (current) drug therapy: Secondary | ICD-10-CM | POA: Diagnosis not present

## 2016-07-26 DIAGNOSIS — L4050 Arthropathic psoriasis, unspecified: Secondary | ICD-10-CM | POA: Diagnosis not present

## 2016-07-27 ENCOUNTER — Ambulatory Visit: Payer: 59 | Admitting: Family Medicine

## 2016-08-23 ENCOUNTER — Encounter: Payer: Self-pay | Admitting: Family Medicine

## 2016-08-23 ENCOUNTER — Ambulatory Visit (INDEPENDENT_AMBULATORY_CARE_PROVIDER_SITE_OTHER): Payer: 59 | Admitting: Family Medicine

## 2016-08-23 ENCOUNTER — Ambulatory Visit: Payer: 59 | Admitting: Family Medicine

## 2016-08-23 VITALS — BP 130/84 | HR 76 | Temp 98.5°F | Ht 76.75 in | Wt 281.5 lb

## 2016-08-23 DIAGNOSIS — L4050 Arthropathic psoriasis, unspecified: Secondary | ICD-10-CM

## 2016-08-23 NOTE — Progress Notes (Signed)
Pre visit review using our clinic review tool, if applicable. No additional management support is needed unless otherwise documented below in the visit note. 

## 2016-08-23 NOTE — Patient Instructions (Addendum)
[name] will call about your referral. If for some reason you can't get in before your next set of labs are due, then let me know.   I may be able to help with the labs prior to the appointment with rheumatology.  Take care.  Glad to see you. No charge for visit.

## 2016-08-23 NOTE — Progress Notes (Signed)
His axillary changes are better on prev deoderant, he changed back.   Dr. Kellie Simmeringruslow is retiring and he needed a referral to rheumatology for psoriatic arthritis  He is trying manage with his meds at his.  He is having some hand pain affecting his grip, which is dependent on weather changes.    Meds, vitals, and allergies reviewed.   ROS: Per HPI unless specifically indicated in ROS section   nad Exam deferred.

## 2016-08-24 NOTE — Assessment & Plan Note (Signed)
Refer. No charge for office visit. This could've been handled via phone call.  If patient is not going to be able to see rheumatology when his next set of labs are due, then he can let me know.

## 2016-09-23 ENCOUNTER — Other Ambulatory Visit: Payer: Self-pay | Admitting: Family Medicine

## 2016-10-06 DIAGNOSIS — M150 Primary generalized (osteo)arthritis: Secondary | ICD-10-CM | POA: Diagnosis not present

## 2016-10-06 DIAGNOSIS — L4059 Other psoriatic arthropathy: Secondary | ICD-10-CM | POA: Diagnosis not present

## 2016-10-06 DIAGNOSIS — M5136 Other intervertebral disc degeneration, lumbar region: Secondary | ICD-10-CM | POA: Diagnosis not present

## 2016-10-11 DIAGNOSIS — M545 Low back pain: Secondary | ICD-10-CM | POA: Diagnosis not present

## 2016-10-11 DIAGNOSIS — M5416 Radiculopathy, lumbar region: Secondary | ICD-10-CM | POA: Diagnosis not present

## 2016-10-11 DIAGNOSIS — M5136 Other intervertebral disc degeneration, lumbar region: Secondary | ICD-10-CM | POA: Diagnosis not present

## 2016-10-26 ENCOUNTER — Telehealth: Payer: Self-pay

## 2016-10-26 NOTE — Telephone Encounter (Signed)
Damary RN Case mgr with [employer] left v/m requesting cb; Estanislado SpireDamary has been unable to reach pt to get pt enrolled in a program [facility]. Damary wants cb when last time pt was seen and any assistance to get in touch with pt. Pt last seen 08/23/16. Left v/m requesting Damary to cb.

## 2016-10-26 NOTE — Telephone Encounter (Signed)
Damary called back and advised last seen 08/23/16; advised Damary to try and call pt again or if cannot reach by phone to send letter. Damary voiced understanding.

## 2016-11-11 ENCOUNTER — Encounter: Payer: Self-pay | Admitting: Family Medicine

## 2016-11-11 ENCOUNTER — Ambulatory Visit (INDEPENDENT_AMBULATORY_CARE_PROVIDER_SITE_OTHER): Payer: 59 | Admitting: Family Medicine

## 2016-11-11 ENCOUNTER — Ambulatory Visit (INDEPENDENT_AMBULATORY_CARE_PROVIDER_SITE_OTHER)
Admission: RE | Admit: 2016-11-11 | Discharge: 2016-11-11 | Disposition: A | Discharge status: Home / Self Care | Payer: 59 | Source: Ambulatory Visit | Attending: Family Medicine | Admitting: Family Medicine

## 2016-11-11 VITALS — BP 128/78 | HR 72 | Temp 98.6°F | Wt 280.0 lb

## 2016-11-11 DIAGNOSIS — R0789 Other chest pain: Secondary | ICD-10-CM

## 2016-11-11 NOTE — Patient Instructions (Signed)
Try a half tab of zanaflex, with tramadol as needed and use a heating pad.   Update me as needed.   Take care.  Glad to see you.

## 2016-11-11 NOTE — Progress Notes (Signed)
R sided rib and chest wall pain.  Started about 1 month ago, intermittent pain.  Pain with certain movements.  The day before yesterday he had sig pain.  Sig pain, when it flares up.  No trauma, no trigger.  Not ttp  No L sided pain.  Pain with a deep breath, when the pain flares.  No bruising.  No FCNAVD.  No jaundice.  He does a lot of lifting at work, a lot of lifting and rotation at the same time which may have aggravated this. He doesn't have symptoms that are aggravated by eating.    Meds, vitals, and allergies reviewed.   ROS: Per HPI unless specifically indicated in ROS section   nad ncat rrr ctab Neck supple, no LA Right side of chest wall tender both anterior and posterior. No bruising. No rash. Tenderness on the test chest wall is diffuse. It extends down to the oblique muscles on the right side of the abdominal wall.  He does have right upper quadrant pain but I think it is due to oblique tenderness and not another intra-abdominal process. Normal bowel sounds. Abdomen soft otherwise. Ext w/o edema.

## 2016-11-13 DIAGNOSIS — R0789 Other chest pain: Secondary | ICD-10-CM | POA: Insufficient documentation

## 2016-11-13 NOTE — Assessment & Plan Note (Signed)
Discussed with patient. Given his medications and his history, reasonable to x-ray. X-ray negative for fracture. No pneumothorax. I am not concerned for an intrathoracic or intra-abdominal process at this point. Discussed with patient. I think this is way more likely to be a chronic muscle strain that he is reaggravated with his routine work activities. Discussed with patient about low dose tizanidine if needed, with sedation caution and relative rest otherwise. He agrees and he will update me as needed. Okay for outpatient follow-up.

## 2017-01-06 DIAGNOSIS — M150 Primary generalized (osteo)arthritis: Secondary | ICD-10-CM | POA: Diagnosis not present

## 2017-01-06 DIAGNOSIS — M5136 Other intervertebral disc degeneration, lumbar region: Secondary | ICD-10-CM | POA: Diagnosis not present

## 2017-01-06 DIAGNOSIS — L4059 Other psoriatic arthropathy: Secondary | ICD-10-CM | POA: Diagnosis not present

## 2017-03-23 ENCOUNTER — Other Ambulatory Visit: Payer: Self-pay | Admitting: Family Medicine

## 2017-07-18 DIAGNOSIS — M150 Primary generalized (osteo)arthritis: Secondary | ICD-10-CM | POA: Diagnosis not present

## 2017-07-18 DIAGNOSIS — L4059 Other psoriatic arthropathy: Secondary | ICD-10-CM | POA: Diagnosis not present

## 2017-07-18 DIAGNOSIS — M5136 Other intervertebral disc degeneration, lumbar region: Secondary | ICD-10-CM | POA: Diagnosis not present

## 2017-08-22 DIAGNOSIS — M25512 Pain in left shoulder: Secondary | ICD-10-CM | POA: Diagnosis not present

## 2017-08-29 DIAGNOSIS — M25512 Pain in left shoulder: Secondary | ICD-10-CM | POA: Diagnosis not present

## 2017-09-05 DIAGNOSIS — M25512 Pain in left shoulder: Secondary | ICD-10-CM | POA: Diagnosis not present

## 2017-09-12 ENCOUNTER — Other Ambulatory Visit: Payer: Self-pay | Admitting: Family Medicine

## 2017-10-11 ENCOUNTER — Other Ambulatory Visit: Payer: Self-pay | Admitting: Family Medicine

## 2017-10-18 DIAGNOSIS — L4059 Other psoriatic arthropathy: Secondary | ICD-10-CM | POA: Diagnosis not present

## 2017-10-18 DIAGNOSIS — M150 Primary generalized (osteo)arthritis: Secondary | ICD-10-CM | POA: Diagnosis not present

## 2017-10-18 DIAGNOSIS — M5136 Other intervertebral disc degeneration, lumbar region: Secondary | ICD-10-CM | POA: Diagnosis not present

## 2017-11-11 ENCOUNTER — Other Ambulatory Visit: Payer: Self-pay | Admitting: Family Medicine

## 2017-11-13 ENCOUNTER — Encounter: Payer: Self-pay | Admitting: *Deleted

## 2017-12-19 ENCOUNTER — Other Ambulatory Visit: Payer: Self-pay | Admitting: Family Medicine

## 2017-12-19 DIAGNOSIS — I10 Essential (primary) hypertension: Secondary | ICD-10-CM

## 2017-12-20 ENCOUNTER — Other Ambulatory Visit (INDEPENDENT_AMBULATORY_CARE_PROVIDER_SITE_OTHER): Payer: 59

## 2017-12-20 DIAGNOSIS — I10 Essential (primary) hypertension: Secondary | ICD-10-CM

## 2017-12-20 LAB — COMPREHENSIVE METABOLIC PANEL
ALBUMIN: 4.3 g/dL (ref 3.5–5.2)
ALT: 20 U/L (ref 0–53)
AST: 15 U/L (ref 0–37)
Alkaline Phosphatase: 53 U/L (ref 39–117)
BUN: 18 mg/dL (ref 6–23)
CHLORIDE: 104 meq/L (ref 96–112)
CO2: 30 mEq/L (ref 19–32)
Calcium: 9.3 mg/dL (ref 8.4–10.5)
Creatinine, Ser: 1.13 mg/dL (ref 0.40–1.50)
GFR: 72.01 mL/min (ref >60.00)
Glucose, Bld: 97 mg/dL (ref 70–99)
POTASSIUM: 4.4 meq/L (ref 3.5–5.1)
SODIUM: 140 meq/L (ref 135–145)
Total Bilirubin: 1 mg/dL (ref 0.2–1.2)
Total Protein: 6.9 g/dL (ref 6.0–8.3)

## 2017-12-20 LAB — CBC WITH DIFFERENTIAL/PLATELET
BASOS PCT: 1 % (ref 0.0–3.0)
Basophils Absolute: 0.1 10*3/uL (ref 0.0–0.1)
EOS ABS: 0.2 10*3/uL (ref 0.0–0.7)
EOS PCT: 2.5 % (ref 0.0–5.0)
HCT: 43.9 % (ref 39.0–52.0)
Hemoglobin: 15.2 g/dL (ref 13.0–17.0)
Lymphocytes Relative: 23.9 % (ref 12.0–46.0)
Lymphs Abs: 1.5 10*3/uL (ref 0.7–4.0)
MCHC: 34.7 g/dL (ref 30.0–36.0)
MCV: 96.4 fl (ref 78.0–100.0)
MONO ABS: 0.6 10*3/uL (ref 0.1–1.0)
Monocytes Relative: 8.6 % (ref 3.0–12.0)
Neutro Abs: 4.1 10*3/uL (ref 1.4–7.7)
Neutrophils Relative %: 64 % (ref 43.0–77.0)
PLATELETS: 157 10*3/uL (ref 150.0–400.0)
RBC: 4.56 Mil/uL (ref 4.22–5.81)
RDW: 13.4 % (ref 11.5–15.5)
WBC: 6.5 10*3/uL (ref 4.0–10.5)

## 2017-12-20 LAB — LIPID PANEL
CHOLESTEROL: 121 mg/dL (ref 0–200)
HDL: 46 mg/dL (ref >39.00)
LDL Cholesterol: 59 mg/dL (ref 0–99)
NonHDL: 74.5
TRIGLYCERIDES: 80 mg/dL (ref 0.0–149.0)
Total CHOL/HDL Ratio: 3
VLDL: 16 mg/dL (ref 0.0–40.0)

## 2017-12-26 ENCOUNTER — Encounter: Payer: Self-pay | Admitting: Family Medicine

## 2017-12-26 ENCOUNTER — Ambulatory Visit (INDEPENDENT_AMBULATORY_CARE_PROVIDER_SITE_OTHER): Payer: 59 | Admitting: Family Medicine

## 2017-12-26 ENCOUNTER — Ambulatory Visit (INDEPENDENT_AMBULATORY_CARE_PROVIDER_SITE_OTHER)
Admission: RE | Admit: 2017-12-26 | Discharge: 2017-12-26 | Disposition: A | Discharge status: Home / Self Care | Payer: 59 | Source: Ambulatory Visit | Attending: Family Medicine | Admitting: Family Medicine

## 2017-12-26 VITALS — BP 132/74 | HR 81 | Temp 98.6°F | Ht 76.75 in | Wt 296.2 lb

## 2017-12-26 DIAGNOSIS — Z7189 Other specified counseling: Secondary | ICD-10-CM

## 2017-12-26 DIAGNOSIS — M25561 Pain in right knee: Secondary | ICD-10-CM | POA: Diagnosis not present

## 2017-12-26 DIAGNOSIS — Z0000 Encounter for general adult medical examination without abnormal findings: Secondary | ICD-10-CM | POA: Diagnosis not present

## 2017-12-26 DIAGNOSIS — L4050 Arthropathic psoriasis, unspecified: Secondary | ICD-10-CM

## 2017-12-26 DIAGNOSIS — Z1211 Encounter for screening for malignant neoplasm of colon: Secondary | ICD-10-CM

## 2017-12-26 DIAGNOSIS — Z23 Encounter for immunization: Secondary | ICD-10-CM

## 2017-12-26 DIAGNOSIS — I10 Essential (primary) hypertension: Secondary | ICD-10-CM

## 2017-12-26 DIAGNOSIS — G479 Sleep disorder, unspecified: Secondary | ICD-10-CM

## 2017-12-26 MED ORDER — LISINOPRIL 20 MG PO TABS: 20.0000 mg | ORAL_TABLET | Freq: Every day | ORAL | 3 refills | Status: DC

## 2017-12-26 NOTE — Patient Instructions (Addendum)
Ask up front about getting Danny's phone number in the chart.   Go to the lab on the way out.  We'll contact you with your lab and xray report. Take care.  Glad to see you.  We'll send your labs and notes to Dr. Dierdre ForthBeekman.

## 2017-12-26 NOTE — Progress Notes (Signed)
CPE- See plan.  Routine anticipatory guidance given to patient.  See health maintenance.  The possibility exists that previously documented standard health maintenance information may have been brought forward from a previous encounter into this note.  If needed, that same information has been updated to reflect the current situation based on today's encounter.    Tetanus 2019 Flu encouraged, d/w pt.   PNA d/w pt.   Shingles out of stock.   D/w [name]:options for colon cancer screening, including IFOB vs. colonoscopy.  Risks and benefits of both were discussed and patient voiced understanding.  Pt elects for: IFOB.   Prostate cancer screening and PSA options (with potential risks and benefits of testing vs not testing) were discussed along with recent recs/guidelines.  He declined testing PSA at this point. Diet and exercise d/w pt.  Encouraged both.  Exercise mainly at work. Living will d/w pt.  Brother Dannielle HuhDanny designated if patient were incapacitated.   Episode 1 month ago.  He was asleep and then woke up with it, at 1:20 AM. He had some tingling from the head/scalp down to the feet, covered all of the skin bilaterally.  He felt like he couldn't move at the time.  "Everything was locked up."  He could still see.  Lasted about 20 minutes.  No sx like this prior or sense.  Everything went back to normal in about 20 minutes.  He sat up on the side of the bed.  No sx o/w.  He went to work the next day.  No tongue biting, he wasn't confused after the event.   Hypertension:    Using medication without problems or lightheadedness: yes Chest pain with exertion:no Edema:no Short of breath:no  He is living at home with his 2 adjust stepdaughters.  He is trying to adjust to the situation after the death of his wife.  D/w pt.    He is still seeing Dr. Dierdre ForthBeekman with re: psoriatic arthritis.  I'll defer.  Enbrel/MTX combination is tolerated, helping.  occ aches, he puts up with that.    R knee anterior  (at about 1 o'clock on the patella) pain worse with prolonged sitting, esp driving more than 1 hour.  Pain goes on at baseline, but sometimes worse than others.  No specific trauma but he works hard at baseline.  He has tried an otc brace.  No locking, no clicking.  Normal ROM.  enbrel doesn't help the pain.  Going on for a few months.  Pain squatting, kneeling.  His is still working on the weekends, in addition to the weekdays.    PMH and SH reviewed  Meds, vitals, and allergies reviewed.   ROS: Per HPI.  Unless specifically indicated otherwise in HPI, the patient denies:  General: fever. Eyes: acute vision changes ENT: sore throat Cardiovascular: chest pain Respiratory: SOB GI: vomiting GU: dysuria Musculoskeletal: acute back pain Derm: acute rash Neuro: acute motor dysfunction Psych: worsening mood Endocrine: polydipsia Heme: bleeding Allergy: hayfever  GEN: nad, alert and oriented HEENT: mucous membranes moist NECK: supple w/o LA CV: rrr. PULM: ctab, no inc wob ABD: soft, +bs EXT: no edema SKIN: no acute rash  normal range of motion right knee but with crepitus noted.  He has tenderness on the proximal/superior portion of the patella, at about 1 o'clock, just medial from the midline of the patella

## 2017-12-27 DIAGNOSIS — G479 Sleep disorder, unspecified: Secondary | ICD-10-CM | POA: Insufficient documentation

## 2017-12-27 DIAGNOSIS — M25561 Pain in right knee: Secondary | ICD-10-CM | POA: Insufficient documentation

## 2017-12-27 NOTE — Assessment & Plan Note (Signed)
He had this episode where he woke up with bilateral paresthesias and he felt like he was paralyzed.  It lasted about 20 minutes and then gradually resolved.  He did not have any known seizure activity with this or in any recent years.  He did not bite his tongue or have typical post ictal symptoms.  I want to consider options and then will get in touch with the patient.  He is only had one episode.  He has no other neurologic symptoms.

## 2017-12-27 NOTE — Assessment & Plan Note (Signed)
Living will d/w pt.  Brother Danny designated if patient were incapacitated. 

## 2017-12-27 NOTE — Assessment & Plan Note (Signed)
Per rheumatology.  I will defer.  Patient agrees.

## 2017-12-27 NOTE — Assessment & Plan Note (Signed)
Tetanus 2019 Flu encouraged, d/w pt.   PNA d/w pt.   Shingles out of stock.   D/w [name]:options for colon cancer screening, including IFOB vs. colonoscopy.  Risks and benefits of both were discussed and patient voiced understanding.  Pt elects for: IFOB.   Prostate cancer screening and PSA options (with potential risks and benefits of testing vs not testing) were discussed along with recent recs/guidelines.  He declined testing PSA at this point. Diet and exercise d/w pt.  Encouraged both.  Exercise mainly at work. Living will d/w pt.  Brother Dannielle HuhDanny designated if patient were incapacitated.

## 2017-12-27 NOTE — Assessment & Plan Note (Signed)
He does not have flares of psoriasis now.  The concern is for osteoarthritis.  Reasonable to check plain films today.  See notes on imaging.

## 2017-12-27 NOTE — Assessment & Plan Note (Signed)
Reasonable control.  No change in meds.  Labs discussed with patient.  He agrees. 

## 2017-12-31 ENCOUNTER — Telehealth: Payer: Self-pay | Admitting: Family Medicine

## 2017-12-31 NOTE — Telephone Encounter (Signed)
Call pt. This is about the episode he had when he woke up from sleep and was unable to move for a period of minutes.  I wanted to look up some options in the meantime and he should be expecting a call back.  This sounds like an isolated episode of sleep paralysis.  Long story short, I see several options.  We can send him over to neurology/sleep clinic now for them to talk to the patient.  I doubt they would do anything differently for him since he has only had one episode, but I am happy to put in the referral if he would like to go.  If he had any recurrent episodes or other sleep changes (sleep apnea, etc), then it would make more sense for him to go see neurology/sleep clinic.  Basically, sleep paralysis (assuming that is what he had) is when the REM sleep-related changes persist into wakefulness.  Patients with sleep paralysis are completely unable to move or call out and may remember these events for years.  Although sleep paralysis can occur with narcolepsy, in most cases sleep paralysis is an isolated symptom and is related to sleep deprivation.   Let me know what he thinks about seeing neuro and we'll go from there.

## 2018-01-01 NOTE — Telephone Encounter (Signed)
Patient notified as instructed by telephone and verbalized understanding. Patient stated that this episode has only happened once. Patient stated that he wants to hold off on seeing neurology at this point. Patient stated that he will call back if this happens again.

## 2018-01-03 NOTE — Telephone Encounter (Signed)
Noted.  Thanks.  Noted that patient doesn't have h/o narcolepsy, etc, o/w.

## 2018-01-08 ENCOUNTER — Other Ambulatory Visit (INDEPENDENT_AMBULATORY_CARE_PROVIDER_SITE_OTHER): Payer: 59

## 2018-01-08 DIAGNOSIS — Z1211 Encounter for screening for malignant neoplasm of colon: Secondary | ICD-10-CM

## 2018-01-09 LAB — FECAL OCCULT BLOOD, IMMUNOCHEMICAL: FECAL OCCULT BLD: NEGATIVE

## 2018-01-23 DIAGNOSIS — M5136 Other intervertebral disc degeneration, lumbar region: Secondary | ICD-10-CM | POA: Diagnosis not present

## 2018-01-23 DIAGNOSIS — M150 Primary generalized (osteo)arthritis: Secondary | ICD-10-CM | POA: Diagnosis not present

## 2018-01-23 DIAGNOSIS — M25561 Pain in right knee: Secondary | ICD-10-CM | POA: Diagnosis not present

## 2018-01-23 DIAGNOSIS — L4059 Other psoriatic arthropathy: Secondary | ICD-10-CM | POA: Diagnosis not present

## 2018-04-25 DIAGNOSIS — L4059 Other psoriatic arthropathy: Secondary | ICD-10-CM | POA: Diagnosis not present

## 2018-04-25 DIAGNOSIS — M150 Primary generalized (osteo)arthritis: Secondary | ICD-10-CM | POA: Diagnosis not present

## 2018-04-25 DIAGNOSIS — M5136 Other intervertebral disc degeneration, lumbar region: Secondary | ICD-10-CM | POA: Diagnosis not present

## 2018-05-03 DIAGNOSIS — S43432D Superior glenoid labrum lesion of left shoulder, subsequent encounter: Secondary | ICD-10-CM | POA: Diagnosis not present

## 2018-05-03 DIAGNOSIS — M25512 Pain in left shoulder: Secondary | ICD-10-CM | POA: Diagnosis not present

## 2018-05-03 DIAGNOSIS — M75102 Unspecified rotator cuff tear or rupture of left shoulder, not specified as traumatic: Secondary | ICD-10-CM | POA: Diagnosis not present

## 2018-06-11 DIAGNOSIS — G8918 Other acute postprocedural pain: Secondary | ICD-10-CM | POA: Diagnosis not present

## 2018-06-11 DIAGNOSIS — M19012 Primary osteoarthritis, left shoulder: Secondary | ICD-10-CM | POA: Diagnosis not present

## 2018-06-11 DIAGNOSIS — S43432A Superior glenoid labrum lesion of left shoulder, initial encounter: Secondary | ICD-10-CM | POA: Diagnosis not present

## 2018-06-11 DIAGNOSIS — M94212 Chondromalacia, left shoulder: Secondary | ICD-10-CM | POA: Diagnosis not present

## 2018-06-11 HISTORY — PX: OTHER SURGICAL HISTORY: SHX169

## 2018-06-14 DIAGNOSIS — M25512 Pain in left shoulder: Secondary | ICD-10-CM | POA: Diagnosis not present

## 2018-06-19 DIAGNOSIS — M25512 Pain in left shoulder: Secondary | ICD-10-CM | POA: Diagnosis not present

## 2018-06-21 DIAGNOSIS — M25512 Pain in left shoulder: Secondary | ICD-10-CM | POA: Diagnosis not present

## 2018-06-26 DIAGNOSIS — M25512 Pain in left shoulder: Secondary | ICD-10-CM | POA: Diagnosis not present

## 2018-06-28 DIAGNOSIS — M25512 Pain in left shoulder: Secondary | ICD-10-CM | POA: Diagnosis not present

## 2018-07-03 DIAGNOSIS — M25512 Pain in left shoulder: Secondary | ICD-10-CM | POA: Diagnosis not present

## 2018-07-30 DIAGNOSIS — M150 Primary generalized (osteo)arthritis: Secondary | ICD-10-CM | POA: Diagnosis not present

## 2018-07-30 DIAGNOSIS — M5136 Other intervertebral disc degeneration, lumbar region: Secondary | ICD-10-CM | POA: Diagnosis not present

## 2018-07-30 DIAGNOSIS — L4059 Other psoriatic arthropathy: Secondary | ICD-10-CM | POA: Diagnosis not present

## 2018-08-20 DIAGNOSIS — L4059 Other psoriatic arthropathy: Secondary | ICD-10-CM | POA: Diagnosis not present

## 2019-01-08 ENCOUNTER — Telehealth: Payer: Self-pay | Admitting: Family Medicine

## 2019-01-08 DIAGNOSIS — I10 Essential (primary) hypertension: Secondary | ICD-10-CM

## 2019-01-08 NOTE — Telephone Encounter (Signed)
Please schedule patient for CPE when possible. Last CPE was on 12/26/2017. Thank you

## 2019-01-09 NOTE — Telephone Encounter (Signed)
Yes, CPE when possible with lab visit.  Thanks.  Orders are in EMR for labs ahead of time.

## 2019-01-11 NOTE — Telephone Encounter (Signed)
Noted thank you

## 2019-01-11 NOTE — Telephone Encounter (Signed)
lvm asking pt to call us back to schedue cpe with labs

## 2019-01-11 NOTE — Telephone Encounter (Signed)
Patient called to schedule cpe and labs prior. Labs- 02/28/2019 CPE- 03/07/2019

## 2019-02-26 ENCOUNTER — Telehealth: Payer: Self-pay

## 2019-02-26 NOTE — Telephone Encounter (Signed)
Left detailed VM w COVID screen and back door lab info and front door info   

## 2019-02-28 ENCOUNTER — Other Ambulatory Visit (INDEPENDENT_AMBULATORY_CARE_PROVIDER_SITE_OTHER): Payer: 59

## 2019-02-28 ENCOUNTER — Other Ambulatory Visit: Payer: Self-pay

## 2019-02-28 DIAGNOSIS — I10 Essential (primary) hypertension: Secondary | ICD-10-CM | POA: Diagnosis not present

## 2019-02-28 LAB — LIPID PANEL
Cholesterol: 119 mg/dL (ref 0–200)
HDL: 41.2 mg/dL (ref >39.00)
LDL Cholesterol: 62 mg/dL (ref 0–99)
NonHDL: 77.45
Total CHOL/HDL Ratio: 3
Triglycerides: 76 mg/dL (ref 0.0–149.0)
VLDL: 15.2 mg/dL (ref 0.0–40.0)

## 2019-02-28 LAB — CBC WITH DIFFERENTIAL/PLATELET
Basophils Absolute: 0.1 10*3/uL (ref 0.0–0.1)
Basophils Relative: 0.9 % (ref 0.0–3.0)
Eosinophils Absolute: 0.1 10*3/uL (ref 0.0–0.7)
Eosinophils Relative: 1.7 % (ref 0.0–5.0)
HCT: 43.6 % (ref 39.0–52.0)
Hemoglobin: 15 g/dL (ref 13.0–17.0)
Lymphocytes Relative: 25.2 % (ref 12.0–46.0)
Lymphs Abs: 1.5 10*3/uL (ref 0.7–4.0)
MCHC: 34.3 g/dL (ref 30.0–36.0)
MCV: 98.5 fl (ref 78.0–100.0)
Monocytes Absolute: 0.5 10*3/uL (ref 0.1–1.0)
Monocytes Relative: 9.1 % (ref 3.0–12.0)
Neutro Abs: 3.8 10*3/uL (ref 1.4–7.7)
Neutrophils Relative %: 63.1 % (ref 43.0–77.0)
Platelets: 164 10*3/uL (ref 150.0–400.0)
RBC: 4.43 Mil/uL (ref 4.22–5.81)
RDW: 13.3 % (ref 11.5–15.5)
WBC: 6 10*3/uL (ref 4.0–10.5)

## 2019-02-28 LAB — COMPREHENSIVE METABOLIC PANEL
ALT: 35 U/L (ref 0–53)
AST: 22 U/L (ref 0–37)
Albumin: 4.3 g/dL (ref 3.5–5.2)
Alkaline Phosphatase: 57 U/L (ref 39–117)
BUN: 23 mg/dL (ref 6–23)
CO2: 23 mEq/L (ref 19–32)
Calcium: 9.3 mg/dL (ref 8.4–10.5)
Chloride: 108 mEq/L (ref 96–112)
Creatinine, Ser: 1.14 mg/dL (ref 0.40–1.50)
GFR: 66.76 mL/min (ref >60.00)
Glucose, Bld: 106 mg/dL — ABNORMAL HIGH (ref 70–99)
Potassium: 4.4 mEq/L (ref 3.5–5.1)
Sodium: 139 mEq/L (ref 135–145)
Total Bilirubin: 0.6 mg/dL (ref 0.2–1.2)
Total Protein: 6.8 g/dL (ref 6.0–8.3)

## 2019-03-07 ENCOUNTER — Other Ambulatory Visit: Payer: Self-pay

## 2019-03-07 ENCOUNTER — Encounter: Payer: Self-pay | Admitting: Family Medicine

## 2019-03-07 ENCOUNTER — Ambulatory Visit (INDEPENDENT_AMBULATORY_CARE_PROVIDER_SITE_OTHER): Payer: 59 | Admitting: Family Medicine

## 2019-03-07 VITALS — BP 130/70 | HR 85 | Temp 97.3°F | Ht 76.75 in | Wt 304.6 lb

## 2019-03-07 DIAGNOSIS — Z7189 Other specified counseling: Secondary | ICD-10-CM

## 2019-03-07 DIAGNOSIS — Z0000 Encounter for general adult medical examination without abnormal findings: Secondary | ICD-10-CM | POA: Diagnosis not present

## 2019-03-07 DIAGNOSIS — Z1211 Encounter for screening for malignant neoplasm of colon: Secondary | ICD-10-CM

## 2019-03-07 DIAGNOSIS — L4050 Arthropathic psoriasis, unspecified: Secondary | ICD-10-CM

## 2019-03-07 DIAGNOSIS — Z23 Encounter for immunization: Secondary | ICD-10-CM | POA: Diagnosis not present

## 2019-03-07 DIAGNOSIS — I10 Essential (primary) hypertension: Secondary | ICD-10-CM

## 2019-03-07 MED ORDER — LISINOPRIL 20 MG PO TABS: 20.0000 mg | ORAL_TABLET | Freq: Every day | ORAL | 3 refills | Status: DC

## 2019-03-07 NOTE — Progress Notes (Signed)
CPE- See plan.  Routine anticipatory guidance given to patient.  See health maintenance.  The possibility exists that previously documented standard health maintenance information may have been brought forward from a previous encounter into this note.  If needed, that same information has been updated to reflect the current situation based on today's encounter.    Tetanus 2019 Flu 2020 PNA d/w pt.  Shingles due at 5.   D/w patient [id number] for colon cancer screening, including IFOB vs. colonoscopy. Risks and benefits of both were discussed and patient voiced understanding. Pt elects for: IFOB.  Prostate cancer screening and PSA options(with potential risks and benefits of testing vs not testing) were discussed along with recent recs/guidelines. He declined testing PSAat this point. Diet and exercise d/w pt. Encouraged both. Exercise mainly at work. Living will d/w pt. Brother Kasandra Knudsen designated if patient were incapacitated.  Hypertension:    Using medication without problems or lightheadedness: yes Chest pain with exertion:no Edema:no Short of breath:no Labs d/w pt.    Psoriatic arthritis.  He has variable level of pain and stiffness esp in his hands and knees.  Prev knee injection done per rheum, R knee.  He did talk to rheumatology about potentially going up on his methotrexate prescription but per patient report he did not have an ample supply to increase his weekly dose.  He still having significant joint pain.  PMH and SH reviewed  Meds, vitals, and allergies reviewed.   ROS: Per HPI.  Unless specifically indicated otherwise in HPI, the patient denies:  General: fever. Eyes: acute vision changes ENT: sore throat Cardiovascular: chest pain Respiratory: SOB GI: vomiting GU: dysuria Musculoskeletal: acute back pain Derm: acute rash Neuro: acute motor dysfunction Psych: worsening mood Endocrine: polydipsia Heme: bleeding Allergy: hayfever  GEN: nad, alert and  oriented HEENT: ncat NECK: supple w/o LA CV: rrr. PULM: ctab, no inc wob ABD: soft, +bs EXT: trace BLE edema SKIN: no acute rash

## 2019-03-07 NOTE — Patient Instructions (Addendum)
Let me update the rheumatology clinic.  Go to the lab on the way out.  We'll contact you with your lab report. Don't change the lisinopril for now.  Thanks for getting a flu shot.  Take care.  Glad to see you.

## 2019-03-10 ENCOUNTER — Telehealth: Payer: Self-pay | Admitting: Family Medicine

## 2019-03-10 NOTE — Assessment & Plan Note (Signed)
Controlled.  No change in meds.  Labs discussed with patient.  He agrees. 

## 2019-03-10 NOTE — Telephone Encounter (Signed)
Please send copy of most recent office visit note and labs to [facility] rheumatology.  Also please call their office.  I do not know what they would prefer to do at this point in terms of the patient's joint pain.  He still having a lot of joint pain and I think when it was discussed about potentially increasing his methotrexate dose, he did not have an ample supply of pills to increase the dose.  I also do not know if they could inject his knee to help with his pain.  I greatly appreciate their input.  Thanks.

## 2019-03-10 NOTE — Assessment & Plan Note (Signed)
Tetanus 2019 Flu 2020 PNA d/w pt.  Shingles due at 36.   D/w patient [id number] for colon cancer screening, including IFOB vs. colonoscopy. Risks and benefits of both were discussed and patient voiced understanding. Pt elects for: IFOB.  Prostate cancer screening and PSA options(with potential risks and benefits of testing vs not testing) were discussed along with recent recs/guidelines. He declined testing PSAat this point. Diet and exercise d/w pt. Encouraged both. Exercise mainly at work. Living will d/w pt. Brother Kasandra Knudsen designated if patient were incapacitated.

## 2019-03-10 NOTE — Assessment & Plan Note (Signed)
Living will d/w pt.  Brother Danny designated if patient were incapacitated. 

## 2019-03-10 NOTE — Assessment & Plan Note (Signed)
He has variable level of pain and stiffness esp in his hands and knees.  Prev knee injection done per rheum, R knee.  He did talk to rheumatology about potentially going up on his methotrexate prescription but per patient report he did not have an ample supply to increase his weekly dose.  He still having significant joint pain.  See following phone note.

## 2019-03-11 NOTE — Telephone Encounter (Signed)
I have faxed over last OV notes along with long message regarding below. I asked for [facility] Rheumatology input & for them to advise if knee injection might be an option for patient.

## 2019-03-14 ENCOUNTER — Other Ambulatory Visit (INDEPENDENT_AMBULATORY_CARE_PROVIDER_SITE_OTHER): Payer: 59

## 2019-03-14 DIAGNOSIS — Z1211 Encounter for screening for malignant neoplasm of colon: Secondary | ICD-10-CM

## 2019-03-14 LAB — FECAL OCCULT BLOOD, IMMUNOCHEMICAL: Fecal Occult Bld: NEGATIVE

## 2019-06-04 ENCOUNTER — Ambulatory Visit (INDEPENDENT_AMBULATORY_CARE_PROVIDER_SITE_OTHER): Payer: 59 | Admitting: Family Medicine

## 2019-06-04 DIAGNOSIS — J019 Acute sinusitis, unspecified: Secondary | ICD-10-CM

## 2019-06-04 MED ORDER — ALBUTEROL SULFATE HFA 108 (90 BASE) MCG/ACT IN AERS: 1.0000 | INHALATION_SPRAY | Freq: Four times a day (QID) | RESPIRATORY_TRACT | 1 refills | Status: DC | PRN

## 2019-06-04 MED ORDER — AMOXICILLIN-POT CLAVULANATE 875-125 MG PO TABS: 1.0000 | ORAL_TABLET | Freq: Two times a day (BID) | ORAL | 0 refills | Status: DC

## 2019-06-04 NOTE — Progress Notes (Signed)
Interactive audio and video telecommunications were attempted between this provider and patient, however failed, due to patient having technical difficulties OR patient did not have access to video capability.  We continued and completed visit with audio only.   Virtual Visit via Telephone Note  I connected with patient on 06/04/19  at 2:52 PM  by telephone and verified that I am speaking with the correct person using two identifiers.  Location of patient: home   Location of MD: [facility] Name of referring provider (if blank then none associated): Names per persons and role in encounter:  MD: Ferd Hibbs, Patient: name listed above.    I discussed the limitations, risks, security and privacy concerns of performing an evaluation and management service by telephone and the availability of in person appointments. I also discussed with the patient that there may be a patient responsible charge related to this service. The patient expressed understanding and agreed to proceed.  CC: URI sx.   History of Present Illness: sx started about 1 week ago.  He had sinus pressure with the weather change.  Now with a cough.  No sputum usually but some occ yellow sputum.  Voice has been affected.  No fevers.  He can walk but with some dyspnea.  No known covid exposures.  He has normal taste, but smell affected by rhinorrhea.  He missed part of one day of work.  No facial pain now.  He had taken mucinex w/o relief.  No vomiting, no diarrhea.   Fatigue noted.  Some wheeze at night.  Still on enbrel and MTX at baseline.      Observations/Objective: nad Speech wnl.   Assessment and Plan: URI symptoms, presumed sinusitis.  Still okay for outpatient follow-up.  Would treat preemptively given the duration and his baseline medications.  Start Augmentin.  Use SABA as needed.  Rest and fluids. He'll update me as needed.  He agrees with plan.  Follow Up Instructions: see above.    I discussed the  assessment and treatment plan with the patient. The patient was provided an opportunity to ask questions and all were answered. The patient agreed with the plan and demonstrated an understanding of the instructions.   The patient was advised to call back or seek an in-person evaluation if the symptoms worsen or if the condition fails to improve as anticipated.  I provided 15 minutes of non-face-to-face time during this encounter.  Crawford Givens, MD

## 2019-06-05 NOTE — Assessment & Plan Note (Signed)
URI symptoms, presumed sinusitis.  Still okay for outpatient follow-up.  Would treat preemptively given the duration and his baseline medications.  Start Augmentin.  Use SABA as needed.  Rest and fluids. He'll update me as needed.  He agrees with plan.

## 2020-01-28 ENCOUNTER — Other Ambulatory Visit: Payer: Self-pay

## 2020-01-28 ENCOUNTER — Emergency Department (HOSPITAL_COMMUNITY)
Admission: EM | Admit: 2020-01-28 | Discharge: 2020-01-29 | Disposition: A | Discharge status: Home / Self Care | Payer: 59 | Attending: Emergency Medicine | Admitting: Emergency Medicine

## 2020-01-28 ENCOUNTER — Emergency Department (HOSPITAL_COMMUNITY): Payer: 59

## 2020-01-28 ENCOUNTER — Encounter (HOSPITAL_COMMUNITY): Payer: Self-pay

## 2020-01-28 DIAGNOSIS — U071 COVID-19: Secondary | ICD-10-CM

## 2020-01-28 DIAGNOSIS — Z79899 Other long term (current) drug therapy: Secondary | ICD-10-CM | POA: Diagnosis not present

## 2020-01-28 DIAGNOSIS — Z7951 Long term (current) use of inhaled steroids: Secondary | ICD-10-CM | POA: Diagnosis not present

## 2020-01-28 DIAGNOSIS — R0602 Shortness of breath: Secondary | ICD-10-CM | POA: Diagnosis present

## 2020-01-28 DIAGNOSIS — I10 Essential (primary) hypertension: Secondary | ICD-10-CM | POA: Diagnosis not present

## 2020-01-28 DIAGNOSIS — Z87891 Personal history of nicotine dependence: Secondary | ICD-10-CM | POA: Diagnosis not present

## 2020-01-28 DIAGNOSIS — R519 Headache, unspecified: Secondary | ICD-10-CM

## 2020-01-28 NOTE — ED Triage Notes (Signed)
C/o chest tightness and SOB.

## 2020-01-28 NOTE — ED Triage Notes (Signed)
Pt presents c/o headache and ear congestion per pt reports. Reports Covid exposure.

## 2020-01-29 ENCOUNTER — Telehealth: Payer: Self-pay | Admitting: Family Medicine

## 2020-01-29 LAB — TROPONIN I (HIGH SENSITIVITY)
Troponin I (High Sensitivity): 5 ng/L (ref <18)
Troponin I (High Sensitivity): 5 ng/L (ref <18)

## 2020-01-29 LAB — BASIC METABOLIC PANEL
Anion gap: 11 (ref 5–15)
BUN: 21 mg/dL — ABNORMAL HIGH (ref 6–20)
CO2: 24 mmol/L (ref 22–32)
Calcium: 9.1 mg/dL (ref 8.9–10.3)
Chloride: 106 mmol/L (ref 98–111)
Creatinine, Ser: 1.38 mg/dL — ABNORMAL HIGH (ref 0.61–1.24)
GFR calc Af Amer: >60 mL/min (ref >60)
GFR calc non Af Amer: 57 mL/min — ABNORMAL LOW (ref >60)
Glucose, Bld: 124 mg/dL — ABNORMAL HIGH (ref 70–99)
Potassium: 4.3 mmol/L (ref 3.5–5.1)
Sodium: 141 mmol/L (ref 135–145)

## 2020-01-29 LAB — CBC
HCT: 41.8 % (ref 39.0–52.0)
Hemoglobin: 14.4 g/dL (ref 13.0–17.0)
MCH: 33.6 pg (ref 26.0–34.0)
MCHC: 34.4 g/dL (ref 30.0–36.0)
MCV: 97.4 fL (ref 80.0–100.0)
Platelets: 147 10*3/uL — ABNORMAL LOW (ref 150–400)
RBC: 4.29 MIL/uL (ref 4.22–5.81)
RDW: 13.2 % (ref 11.5–15.5)
WBC: 4.9 10*3/uL (ref 4.0–10.5)
nRBC: 0 % (ref 0.0–0.2)

## 2020-01-29 LAB — SARS CORONAVIRUS 2 BY RT PCR (HOSPITAL ORDER, PERFORMED IN ~~LOC~~ HOSPITAL LAB): SARS Coronavirus 2: POSITIVE — AB

## 2020-01-29 MED ORDER — ALBUTEROL SULFATE HFA 108 (90 BASE) MCG/ACT IN AERS
2.0000 | INHALATION_SPRAY | Freq: Once | RESPIRATORY_TRACT | Status: AC
  Administered 2020-01-29: 2 via RESPIRATORY_TRACT
  Filled 2020-01-29: qty 6.7

## 2020-01-29 MED ORDER — DEXAMETHASONE SODIUM PHOSPHATE 10 MG/ML IJ SOLN
10.0000 mg | Freq: Once | INTRAMUSCULAR | Status: AC
  Administered 2020-01-29: 10 mg via INTRAVENOUS
  Filled 2020-01-29: qty 1

## 2020-01-29 MED ORDER — METOCLOPRAMIDE HCL 5 MG/ML IJ SOLN
10.0000 mg | Freq: Once | INTRAMUSCULAR | Status: AC
  Administered 2020-01-29: 10 mg via INTRAVENOUS
  Filled 2020-01-29: qty 2

## 2020-01-29 MED ORDER — DIPHENHYDRAMINE HCL 25 MG PO CAPS
50.0000 mg | ORAL_CAPSULE | Freq: Once | ORAL | Status: AC
  Administered 2020-01-29: 50 mg via ORAL
  Filled 2020-01-29: qty 2

## 2020-01-29 MED ORDER — SODIUM CHLORIDE 0.9 % IV BOLUS
1000.0000 mL | Freq: Once | INTRAVENOUS | Status: AC
  Administered 2020-01-29: 1000 mL via INTRAVENOUS

## 2020-01-29 NOTE — ED Provider Notes (Signed)
[facility] EMERGENCY DEPARTMENT Provider Note   CSN: [id number] Arrival date & time: 01/28/20  2233     History Chief Complaint  Patient presents with  . Shortness of Breath  . Chest Pain  . Headache    Mike Foster is a 54 y.o. male with PMHx HTN, psoriatic arthritis on Enbrel and MTX who presents to the ED today with complaint of gradual onset, constant, achy, diffuse, headache x 2 weeks. Pt also complains of dyspnea on exertion. He reports he assumed he had a sinus infection and did not think much of it however his girlfriend tested positive for COVID 19 this past weekend prompting him to come to the ED. Pt has not been taking anything at home for his symptoms. He is unsure if he has had a fever as he has not been checking it at home. Triage report states that pt has been having chest pains as well however he denies this currently. Pt is not vaccinated against COVID. Denies vision changes, neck stiffness, rash, nausea, vomiting, diarrhea, or any other associated symptoms.   The history is provided by the patient and medical records.       Past Medical History:  Diagnosis Date  . HTN (hypertension)   . Psoriatic arthritis (HCC)    on enbrel and mtx  . Seizure (HCC)    in childhood; none since 5th grade    Patient Active Problem List   Diagnosis Date Noted  . Right knee pain 2018-01-17  . Sleep disorder Jan 17, 2018  . Death of wife 09/30/15  . Groin pain 03/17/2015  . Routine general medical examination at a health care facility 10/20/2014  . Advance care planning 10/20/2014  . Pain in joint, shoulder region 01/09/2014  . Acute sinusitis 07/01/2012  . HYPERTENSION, CONTROLLED 12/29/2009  . PSORIATIC ARTHRITIS 05/14/2007    Past Surgical History:  Procedure Laterality Date  . CERVICAL SPINE SURGERY    . Shoulder surgery Left 06/11/2018       Family History  Problem Relation Age of Onset  . COPD Mother   . Heart disease Mother   . COPD  Father   . Lung cancer Father   . Pancreatic cancer Maternal Aunt   . Colon cancer Neg Hx   . Prostate cancer Neg Hx     Social History   Tobacco Use  . Smoking status: Never Smoker  . Smokeless tobacco: Former Engineer, water Use Topics  . Alcohol use: Yes    Alcohol/week: 0.0 standard drinks    Comment: occasional  . Drug use: No    Home Medications Prior to Admission medications   Medication Sig Start Date End Date Taking? Authorizing Provider  albuterol (VENTOLIN HFA) 108 (90 Base) MCG/ACT inhaler Inhale 1-2 puffs into the lungs every 6 (six) hours as needed for wheezing (okay to fill with albuterol/proair/ventolin.). 06/04/19   Joaquim Nam, MD  amoxicillin-clavulanate (AUGMENTIN) 875-125 MG tablet Take 1 tablet by mouth 2 (two) times daily. 06/04/19   Joaquim Nam, MD  cetirizine (ZYRTEC) 10 MG tablet Take 10 mg by mouth daily.      [provider]  etanercept (ENBREL) 50 MG/ML injection Inject 50 mg into the skin once a week.      [provider]  folic acid (FOLVITE) 1 MG tablet Take 1 mg by mouth daily.    [provider]  lisinopril (ZESTRIL) 20 MG tablet Take 1 tablet (20 mg total) by mouth daily. 03/07/19  Joaquim Namuncan, Graham S, MD  methotrexate (RHEUMATREX) 2.5 MG tablet Take 12.5 mg by mouth once a week. Caution:Chemotherapy. Protect from light.    [provider]    Allergies    Patient has no known allergies.  Review of Systems   Review of Systems  Constitutional: Positive for fatigue.  Eyes: Negative for visual disturbance.  Respiratory: Positive for cough and shortness of breath.   Cardiovascular: Negative for chest pain.  Gastrointestinal: Negative for nausea and vomiting.  Musculoskeletal: Positive for myalgias. Negative for neck pain and neck stiffness.  Skin: Negative for rash.  Neurological: Positive for headaches. Negative for syncope, weakness and numbness.  All other systems reviewed and are  negative.   Physical Exam Updated Vital Signs BP (!) 152/97 (BP Location: Right Arm)   Pulse 94   Temp 99.4 F (37.4 C) (Oral)   Resp 20   SpO2 100%   Physical Exam Vitals and nursing note reviewed.  Constitutional:      Appearance: He is obese. He is not ill-appearing or diaphoretic.  HENT:     Head: Normocephalic and atraumatic.  Eyes:     Extraocular Movements: Extraocular movements intact.     Conjunctiva/sclera: Conjunctivae normal.     Pupils: Pupils are equal, round, and reactive to light.  Cardiovascular:     Rate and Rhythm: Normal rate and regular rhythm.     Pulses: Normal pulses.  Pulmonary:     Effort: Pulmonary effort is normal.     Breath sounds: Decreased breath sounds present. No wheezing, rhonchi or rales.  Abdominal:     Palpations: Abdomen is soft.     Tenderness: There is no abdominal tenderness. There is no guarding or rebound.  Musculoskeletal:     Cervical back: Normal range of motion and neck supple.     Right lower leg: No edema.     Left lower leg: No edema.  Skin:    General: Skin is warm and dry.  Neurological:     Mental Status: He is alert.     Comments: CN 3-12 grossly intact A&O x4 GCS 15 Sensation and strength intact Gait nonataxic including with tandem walking Coordination with finger-to-nose WNL Neg romberg, neg pronator drift     ED Results / Procedures / Treatments   Labs (all labs ordered are listed, but only abnormal results are displayed) Labs Reviewed  SARS CORONAVIRUS 2 BY RT PCR (HOSPITAL ORDER, PERFORMED IN [facility]) - Abnormal; Notable for the following components:      Result Value   SARS Coronavirus 2 POSITIVE (*)    All other components within normal limits  BASIC METABOLIC PANEL - Abnormal; Notable for the following components:   Glucose, Bld 124 (*)    BUN 21 (*)    Creatinine, Ser 1.38 (*)    GFR calc non Af Amer 57 (*)    All other components within normal limits  CBC - Abnormal;  Notable for the following components:   Platelets 147 (*)    All other components within normal limits  TROPONIN I (HIGH SENSITIVITY)  TROPONIN I (HIGH SENSITIVITY)    EKG EKG Interpretation  Date/Time:  Tuesday January 28 2020 22:55:38 EDT Ventricular Rate:  106 PR Interval:  160 QRS Duration: 92 QT Interval:  338 QTC Calculation: 448 R Axis:   -59 Text Interpretation: Sinus tachycardia Left anterior fascicular block Abnormal ECG No significant change since 01/11/2011 Confirmed by Geoffery LyonseLo, Douglas ([id number]) on 01/29/2020 11:58:01 AM   Radiology  DG Chest Portable 1 View  Result Date: 01/29/2020 CLINICAL DATA:  Short of breath, chest pain, ear congestion EXAM: PORTABLE CHEST 1 VIEW COMPARISON:  11/11/2016 FINDINGS: The heart size and mediastinal contours are within normal limits. Both lungs are clear. The visualized skeletal structures are unremarkable. IMPRESSION: No active disease. Electronically Signed   By: Sharlet Salina M.D.   On: 01/29/2020 00:17    Procedures Procedures (including critical care time)  Medications Ordered in ED Medications  sodium chloride 0.9 % bolus 1,000 mL (1,000 mLs Intravenous New Bag/Given 01/29/20 1258)  metoCLOPramide (REGLAN) injection 10 mg (10 mg Intravenous Given 01/29/20 1258)  diphenhydrAMINE (BENADRYL) capsule 50 mg (50 mg Oral Given 01/29/20 1257)  dexamethasone (DECADRON) injection 10 mg (10 mg Intravenous Given 01/29/20 1258)  albuterol (VENTOLIN HFA) 108 (90 Base) MCG/ACT inhaler 2 puff (2 puffs Inhalation Given 01/29/20 1256)    ED Course  I have reviewed the triage vital signs and the nursing notes.  Pertinent labs & imaging results that were available during my care of the patient were reviewed by me and considered in my medical decision making (see chart for details).  Clinical Course as of Jan 28 1418  Wed Jan 29, 2020  1212 SARS Coronavirus 2(!): POSITIVE [MV]    Clinical Course User Index [MV] Tanda Rockers, [place]   MDM  Rules/Calculators/A&P                          54 year old male with past medical history of psoriatic arthritis who presents to the ED today complaining of diffuse headache persistently for the past 2 weeks.  He has also been having body aches, cough, shortness of breath.  He has been vaccinated against COVID-19.  His girlfriend tested positive this weekend prompting him to come to the ED.  On arrival to the ED patient is afebrile, mildly tachycardic in the low 100s, nontachypneic.  He had lab work done while he was in the waiting room for several hours.  EKG without significant change since last tracing.  Chest x-ray clear.  Creatinine mildly elevated at 1.38 and BUN elevated at 21.  Negative BMP without electrolyte abnormalities.  CBC without leukocytosis, hemoglobin stable at 14.4.  Troponins negative x2.  COVID test has come back positive.  Lab Results  Component Value Date   CREATININE 1.38 (H) 01/28/2020   CREATININE 1.14 02/28/2019   CREATININE 1.13 12/20/2017   On my exam patient is mostly complaining of his headache.  He states he assumed he had a sinus infection.  He has no focal neuro deficits on exam today.  No meningeal signs.  Provide headache cocktail in the ED today as well as albuterol inhaler as he has some diminished breath sounds throughout.  Given patient symptoms have been ongoing for 2 weeks he unfortunately is no longer a candidate for monoclonal antibodies.   Pt ambulated with pulse ox without desaturation.   On reevaluation pt reports his headache has subsided some with headache cocktail. Will discharge home at this time with instructions to self isolate for 14 days (pt has been having symptoms for roughly 2 weeks however has not tested positive until today). Tylenol and Ibuprofen PRN for headache and body aches and drinking plenty of water to stay hydrated. Strict return precautions have been discussed with pt. He is in agreement with plan and stable for discharge home.    This note was prepared using Dragon voice recognition software and may include unintentional  dictation errors due to the inherent limitations of voice recognition software.  Bode Pieper was evaluated in Emergency Department on 01/29/2020 for the symptoms described in the history of present illness. He was evaluated in the context of the global COVID-19 pandemic, which necessitated consideration that the patient might be at risk for infection with the SARS-CoV-2 virus that causes COVID-19. Institutional protocols and algorithms that pertain to the evaluation of patients at risk for COVID-19 are in a state of rapid change based on information released by regulatory bodies including the CDC and federal and state organizations. These policies and algorithms were followed during the patient's care in the ED.  Final Clinical Impression(s) / ED Diagnoses Final diagnoses:  COVID-19  Bad headache    Rx / DC Orders ED Discharge Orders    None       Discharge Instructions     You have tested POSITIVE for COVID 19 today. Please self isolate at home for the next 14 days. Drink plenty of fluids to stay hydrated. Take Tylenol and Ibuprofen as needed for fevers, headache, and body aches - follow package insert for dosage recommendations.   Use the albuterol inhaler as needed for shortness of breath/chest tightness -  you can use 2 puffs every 4 hours.   Return to the ED IMMEDIATELY for any worsening symptoms including worsening shortness of breath, severe chest pain, lips/fingers turning blue, passing out, or any other new/concerning symptoms.        Tanda Rockers, PA-C 01/29/20 1426    Pricilla Loveless, MD 01/30/20 [phone]

## 2020-01-29 NOTE — Telephone Encounter (Signed)
Pt called to cancel appointment for tomorrow.  He stated he is in ER now and has tested positive for covid

## 2020-01-29 NOTE — Telephone Encounter (Signed)
Noted.  Thanks.  Please get update on patient. 

## 2020-01-29 NOTE — Discharge Instructions (Addendum)
You have tested POSITIVE for COVID 19 today. Please self isolate at home for the next 14 days. Drink plenty of fluids to stay hydrated. Take Tylenol and Ibuprofen as needed for fevers, headache, and body aches - follow package insert for dosage recommendations.   Use the albuterol inhaler as needed for shortness of breath/chest tightness -  you can use 2 puffs every 4 hours.   Return to the ED IMMEDIATELY for any worsening symptoms including worsening shortness of breath, severe chest pain, lips/fingers turning blue, passing out, or any other new/concerning symptoms.

## 2020-01-29 NOTE — ED Notes (Signed)
Reviewed discharge instructions with patient. Follow-up care and medications reviewed. Patient  verbalized understanding. Patient A&Ox4, VSS, and ambulatory with steady gait upon discharge.  °

## 2020-01-30 ENCOUNTER — Ambulatory Visit: Payer: 59 | Admitting: Family Medicine

## 2020-01-30 NOTE — Telephone Encounter (Signed)
Patient says he is coughing and his chest feels tight, some loss of taste, joint aches but otherwise ok so far.  Patient advised to call if he needs anything and he was appreciative.

## 2020-01-31 NOTE — Telephone Encounter (Signed)
Noted. Thanks.

## 2020-04-03 ENCOUNTER — Other Ambulatory Visit: Payer: Self-pay | Admitting: Family Medicine

## 2020-04-03 NOTE — Telephone Encounter (Signed)
Pharmacy requests refill on: Lisinopril 20 mg  LAST REFILL: 03/07/2019 LAST OV: 06/04/2019 NEXT OV: Not Schduled PHARMACY: [id number] [place], [place]

## 2020-07-04 ENCOUNTER — Other Ambulatory Visit: Payer: Self-pay | Admitting: Family Medicine

## 2020-10-02 ENCOUNTER — Ambulatory Visit: Payer: 59 | Admitting: Family Medicine

## 2020-10-02 ENCOUNTER — Encounter: Payer: Self-pay | Admitting: Family Medicine

## 2020-10-02 ENCOUNTER — Other Ambulatory Visit: Payer: Self-pay

## 2020-10-02 ENCOUNTER — Other Ambulatory Visit: Payer: Self-pay | Admitting: Family Medicine

## 2020-10-02 DIAGNOSIS — M5430 Sciatica, unspecified side: Secondary | ICD-10-CM

## 2020-10-02 DIAGNOSIS — L4050 Arthropathic psoriasis, unspecified: Secondary | ICD-10-CM | POA: Diagnosis not present

## 2020-10-02 MED ORDER — METHOTREXATE 2.5 MG PO TABS: 15.0000 mg | ORAL_TABLET | ORAL | Status: AC

## 2020-10-02 MED ORDER — PREDNISONE 20 MG PO TABS: ORAL_TABLET | ORAL | 0 refills | Status: DC
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
## 2020-10-02 NOTE — Patient Instructions (Signed)
Take prednisone 2 a day for 5 days, then 1 a day for 5 days, with food. Don't take with aleve/ibuprofen. Use the back exercises and update me as needed.  Take care.  Glad to see you.

## 2020-10-04 DIAGNOSIS — M5430 Sciatica, unspecified side: Secondary | ICD-10-CM | POA: Insufficient documentation

## 2020-10-04 NOTE — Assessment & Plan Note (Signed)
Take prednisone 2 a day for 5 days, then 1 a day for 5 days, with food. Don't take with aleve/ibuprofen.  Routine steroid cautions discussed with patient.  He has a handout with back exercises at home to use. I want him to restart those exercises and update me as needed.  He agrees with plan.  Okay for outpatient follow-up.

## 2020-10-04 NOTE — Assessment & Plan Note (Signed)
He is going to check with rheumatology about restarting Enbrel.  He will update me as needed.

## 2020-10-20 ENCOUNTER — Telehealth: Payer: Self-pay | Admitting: Family Medicine

## 2020-10-20 MED ORDER — LISINOPRIL 20 MG PO TABS: 1.0000 | ORAL_TABLET | Freq: Every day | ORAL | 3 refills | Status: DC

## 2020-10-20 NOTE — Telephone Encounter (Signed)
Patient call in requesting  Med  LAST APPOINTMENT DATE: 10/20/2020   NEXT APPOINTMENT DATE:@Visit  date not found  MEDICATION:lisinopril (ZESTRIL) 20 MG tablet  PHARMACY:[id number] - [place], [place] - [address] AT [facility] OF [address]  Let patient know to contact pharmacy at the end of the day to make sure medication is ready.  Please notify patient to allow 48-72 hours to process  Encourage patient to contact the pharmacy for refills or they can request refills through [facility]  CLINICAL FILLS OUT ALL BELOW:   LAST REFILL:  QTY:  REFILL DATE:    OTHER COMMENTS:    Okay for refill?  Please advise

## 2020-10-20 NOTE — Telephone Encounter (Signed)
Rx sent to [facility]

## 2020-10-20 NOTE — Telephone Encounter (Signed)
Patient callin

## 2020-10-21 NOTE — Telephone Encounter (Signed)
error 

## 2020-11-20 ENCOUNTER — Ambulatory Visit: Payer: 59 | Admitting: Family Medicine

## 2020-11-23 ENCOUNTER — Encounter: Payer: Self-pay | Admitting: Family Medicine

## 2020-11-23 ENCOUNTER — Ambulatory Visit (INDEPENDENT_AMBULATORY_CARE_PROVIDER_SITE_OTHER)
Admission: RE | Admit: 2020-11-23 | Discharge: 2020-11-23 | Disposition: A | Discharge status: Home / Self Care | Payer: 59 | Source: Ambulatory Visit | Attending: Family Medicine | Admitting: Family Medicine

## 2020-11-23 ENCOUNTER — Other Ambulatory Visit: Payer: Self-pay

## 2020-11-23 ENCOUNTER — Ambulatory Visit: Payer: 59 | Admitting: Family Medicine

## 2020-11-23 VITALS — BP 142/82 | HR 82 | Temp 97.3°F | Ht 76.75 in | Wt 298.0 lb

## 2020-11-23 DIAGNOSIS — M25552 Pain in left hip: Secondary | ICD-10-CM

## 2020-11-23 DIAGNOSIS — M5432 Sciatica, left side: Secondary | ICD-10-CM | POA: Diagnosis not present

## 2020-11-23 MED ORDER — GABAPENTIN 100 MG PO CAPS: 100.0000 mg | ORAL_CAPSULE | Freq: Three times a day (TID) | ORAL | 1 refills | Status: DC | PRN

## 2020-11-23 NOTE — Progress Notes (Signed)
This visit occurred during the SARS-CoV-2 public health emergency.  Safety protocols were in place, including screening questions prior to the visit, additional usage of staff PPE, and extensive cleaning of exam room while observing appropriate contact time as indicated for disinfecting solutions.  He is back on enbrel in the meantime.  Discussed.  L hip pain, esp at night.  Fine sleeping on R side. Pain sleeping on L side.  Then waking in pain, has to get up.  Pain if prolonged sitting.  Pain radiating into L posterior leg/numbness in the groin.  No R sided sx.  Prednisone helped a little but he got irritable on the medicine.    Previous imaging discussed. IMPRESSION:  1. Stable to mildly progressed degenerative changes at L2-L3 and L3-L4 with borderline to mild spinal stenosis and chronic left foraminal disc herniations, disc osteophyte complex. 2. Regressed L5-S1 disc herniation with improved patency of the right lateral recess.  Meds, vitals, and allergies reviewed.   ROS: Per HPI unless specifically indicated in ROS section   GEN: nad, alert and oriented HEENT: ncat NECK: supple w/o LA CV: rrr.  PULM: ctab, no inc wob ABD: soft, +bs EXT: no edema SKIN: Well-perfused  L SLR positive.   Midline back not tender to palpation.  Normal hip range of motion bilaterally.

## 2020-11-23 NOTE — Patient Instructions (Signed)
Try taking gabapentin for pain.  Start with 100mg  at night.  Gradually increase the dose if needed/tolerated.  Sedation caution.  Let me know how that goes.  Go to the lab on the way out.   If you have mychart we'll likely use that to update you.    Take care.  Glad to see you.

## 2020-11-25 NOTE — Assessment & Plan Note (Signed)
Discussed options.  Would try taking gabapentin for pain.  Start with 100mg  at night.  Gradually increase the dose if needed/tolerated.  Sedation caution.  I asked him to let me know how that goes.  See notes on imaging.  Okay for outpatient follow-up.

## 2021-02-26 ENCOUNTER — Other Ambulatory Visit: Payer: Self-pay

## 2021-02-26 ENCOUNTER — Encounter: Payer: Self-pay | Admitting: Family Medicine

## 2021-02-26 ENCOUNTER — Ambulatory Visit: Payer: 59 | Admitting: Family Medicine

## 2021-02-26 VITALS — BP 132/84 | HR 72 | Temp 98.5°F | Ht 77.0 in | Wt 312.0 lb

## 2021-02-26 DIAGNOSIS — Z23 Encounter for immunization: Secondary | ICD-10-CM

## 2021-02-26 DIAGNOSIS — M5432 Sciatica, left side: Secondary | ICD-10-CM

## 2021-02-26 DIAGNOSIS — B079 Viral wart, unspecified: Secondary | ICD-10-CM

## 2021-02-26 MED ORDER — PREDNISONE 10 MG PO TABS: ORAL_TABLET | ORAL | 0 refills | Status: DC

## 2021-02-26 NOTE — Patient Instructions (Signed)
We'll call about the MRI.  Start prednisone in the meantime.  The spots on your hands/arm should blister then heal over.  Keep them covered if needed.   Take care.  Glad to see you.

## 2021-02-26 NOTE — Progress Notes (Signed)
This visit occurred during the SARS-CoV-2 public health emergency.  Safety protocols were in place, including screening questions prior to the visit, additional usage of staff PPE, and extensive cleaning of exam room while observing appropriate contact time as indicated for disinfecting solutions.  He tried gabapentin for his back but had sedation with that.  Off med now.  Taking ibuprofen occ.  Still on MTX at baseline.  L lower back pain, radiating down the L leg.  No R or midline pain. No FCNAVD.  He is doing heavy lifting at baseline.    Warts B hands and R forearm.   Meds, vitals, and allergies reviewed.   ROS: Per HPI unless specifically indicated in ROS section   Nad Ncat Neck supple, no LA Rrr Ctab Abd soft, not ttp Ext w/o edema L leg paresthesia and L SLR positive without lower extremity weakness. Multiple warty lesions noted: L hand 8 lesions R hand 2 R forearm 1  All 13 treated with Liq N2 x3 after getting consent from patient.  Discussed with patient about treatment options other than liquid nitrogen prior to proceeding with liquid nitrogen treatment.  No complication with treatment.  Tolerated well.  30 minutes were devoted to patient care in this encounter (this includes time spent reviewing the patient's file/history, interviewing and examining the patient, counseling/reviewing plan with patient).

## 2021-02-28 DIAGNOSIS — B079 Viral wart, unspecified: Secondary | ICD-10-CM | POA: Insufficient documentation

## 2021-02-28 NOTE — Assessment & Plan Note (Signed)
Steroid cautions discussed with patient.  Restart prednisone and we will send patient for MRI L-spine given the duration of his symptoms.  No weakness and still okay for outpatient follow-up.  He agrees with plan.  He may end up needing referral to spine clinic, discussed.

## 2021-02-28 NOTE — Assessment & Plan Note (Signed)
Each lesion frozen x3 with adequate freeze/thaw cycle.  Tolerated well.  Routine cautions given to patient.  He is aware that no treatment is 100% effective and we can retreat in the future if needed.

## 2021-03-17 ENCOUNTER — Telehealth: Payer: Self-pay | Admitting: Family Medicine

## 2021-03-17 NOTE — Telephone Encounter (Signed)
Peer to peer call done re: MRI.  Approval [phone].  Thanks.

## 2021-03-18 NOTE — Telephone Encounter (Signed)
Referral has been updated and pt is scheduled

## 2021-03-19 NOTE — Telephone Encounter (Addendum)
Latonya from [facility] called stating the referral was placed with the wrong site for the authorization. IT is supposed to be place with [facility]   MPI # [id number] and Tax id # [id number] has to be changed by 12 today in order for pt to have appt done on monday

## 2021-03-22 ENCOUNTER — Other Ambulatory Visit: Payer: Self-pay

## 2021-03-22 ENCOUNTER — Ambulatory Visit
Admission: RE | Admit: 2021-03-22 | Discharge: 2021-03-22 | Disposition: A | Discharge status: Home / Self Care | Payer: 59 | Source: Ambulatory Visit | Attending: Family Medicine | Admitting: Family Medicine

## 2021-03-22 DIAGNOSIS — M5432 Sciatica, left side: Secondary | ICD-10-CM

## 2021-03-30 ENCOUNTER — Other Ambulatory Visit: Payer: Self-pay | Admitting: Family Medicine

## 2021-03-30 DIAGNOSIS — M5432 Sciatica, left side: Secondary | ICD-10-CM

## 2021-04-02 ENCOUNTER — Other Ambulatory Visit: Payer: Self-pay | Admitting: Family Medicine

## 2021-04-02 MED ORDER — PREDNISONE 10 MG PO TABS
ORAL_TABLET | ORAL | 0 refills | Status: DC
Start: 2021-04-02 — End: 2021-10-04

## 2021-04-07 ENCOUNTER — Ambulatory Visit: Payer: 59 | Admitting: Nurse Practitioner

## 2021-04-07 ENCOUNTER — Other Ambulatory Visit: Payer: Self-pay

## 2021-04-07 VITALS — BP 162/98 | HR 84 | Temp 98.1°F | Resp 16 | Ht 77.0 in | Wt 316.0 lb

## 2021-04-07 DIAGNOSIS — J101 Influenza due to other identified influenza virus with other respiratory manifestations: Secondary | ICD-10-CM | POA: Insufficient documentation

## 2021-04-07 DIAGNOSIS — R051 Acute cough: Secondary | ICD-10-CM | POA: Diagnosis not present

## 2021-04-07 DIAGNOSIS — G4489 Other headache syndrome: Secondary | ICD-10-CM

## 2021-04-07 LAB — POCT INFLUENZA A/B
Influenza A, POC: POSITIVE — AB
Influenza B, POC: NEGATIVE

## 2021-04-07 NOTE — Patient Instructions (Signed)
Nice to see you I have attached stuff about the flu for you Let me know if there are any any changes to your symptoms

## 2021-04-07 NOTE — Assessment & Plan Note (Signed)
Patient's POCT tests came back positive for influenza A.  Patient is past treatment window.  Given that he is on immune modifying medications we did discuss about starting antiviral anyway did review side effect profile.  Patient states hold off and see how he does.  He is having some shortness of breath and chest tightness has a prescription for steroids it was called in on 04-02-2021.  He has not picked up strongly encouraged him to pick that up that will help with the chest tightness and shortness of breath.  Continue to monitor

## 2021-04-07 NOTE — Assessment & Plan Note (Signed)
Due to influenza infection.  Continue to monitor

## 2021-04-07 NOTE — Progress Notes (Signed)
Acute Office Visit  Subjective:    Patient ID: Mike Foster, male    DOB: Oct 29, 1965, 55 y.o.   MRN: [id number]  Chief Complaint  Patient presents with   Cough    Started on 04/03/21. Chest tightness, SOB, some wheezing, body aches, runny nose, sneezing. No sore throat or fever. Has taking Nyquil.      Patient is in today for  Symptoms started approx 4 days ago. Have been using nyquill and did not help. No sick contacts  No covid vaccines Did not test for covid   Past Medical History:  Diagnosis Date   HTN (hypertension)    Psoriatic arthritis (HCC)    on enbrel and mtx   Seizure (HCC)    in childhood; none since 5th grade    Past Surgical History:  Procedure Laterality Date   CERVICAL SPINE SURGERY     Shoulder surgery Left 06/11/2018    Family History  Problem Relation Age of Onset   COPD Mother    Heart disease Mother    COPD Father    Lung cancer Father    Pancreatic cancer Maternal Aunt    Colon cancer Neg Hx    Prostate cancer Neg Hx     Social History   Socioeconomic History   Marital status: Married    Spouse name: Not on file   Number of children: Not on file   Years of education: Not on file   Highest education level: Not on file  Occupational History   Occupation: Plumber  Tobacco Use   Smoking status: Never   Smokeless tobacco: Former  Substance and Sexual Activity   Alcohol use: Yes    Alcohol/week: 0.0 standard drinks    Comment: occasional   Drug use: No   Sexual activity: Not on file  Other Topics Concern   Not on file  Social History Narrative   widowed 2016 (wife had pulmonary fibrosis)   Lives with his stepdaughter   Social Determinants of Health   Financial Resource Strain: Not on file  Food Insecurity: Not on file  Transportation Needs: Not on file  Physical Activity: Not on file  Stress: Not on file  Social Connections: Not on file  Intimate Partner Violence: Not on file    Outpatient Medications Prior to Visit   Medication Sig Dispense Refill   etanercept (ENBREL) 50 MG/ML injection Inject 50 mg into the skin once a week.     folic acid (FOLVITE) 1 MG tablet Take 1 mg by mouth daily.     lisinopril (ZESTRIL) 20 MG tablet Take 1 tablet (20 mg total) by mouth daily. 90 tablet 3   methotrexate (RHEUMATREX) 2.5 MG tablet Take 6 tablets (15 mg total) by mouth once a week. Caution:Chemotherapy. Protect from light.     predniSONE (DELTASONE) 10 MG tablet Take 2 a day for 5 days, then 1 a day for 5 days, with food. Don't take with aleve/ibuprofen. (Patient not taking: Reported on 04/07/2021) 15 tablet 0   No facility-administered medications prior to visit.    Allergies  Allergen Reactions   Gabapentin     sedation    Review of Systems  Constitutional:  Positive for fatigue. Negative for chills and fever.  HENT:  Positive for congestion, sinus pressure and sinus pain. Negative for ear discharge, ear pain and sore throat.   Respiratory:  Positive for cough, shortness of breath and wheezing.   Cardiovascular:  Negative for chest pain.  Gastrointestinal:  Negative for  abdominal pain, constipation, nausea and vomiting.  Musculoskeletal:  Positive for myalgias.  Neurological:  Positive for headaches.      Objective:    Physical Exam Vitals and nursing note reviewed.  Constitutional:      Appearance: Normal appearance.  HENT:     Right Ear: Tympanic membrane, ear canal and external ear normal. There is no impacted cerumen.     Left Ear: Tympanic membrane, ear canal and external ear normal. There is no impacted cerumen.     Nose: Rhinorrhea present.     Left Sinus: Maxillary sinus tenderness and frontal sinus tenderness present.     Mouth/Throat:     Mouth: Mucous membranes are moist.     Pharynx: No posterior oropharyngeal erythema.  Eyes:     Extraocular Movements: Extraocular movements intact.  Cardiovascular:     Rate and Rhythm: Normal rate and regular rhythm.  Pulmonary:     Effort:  Pulmonary effort is normal.     Breath sounds: Normal breath sounds.  Abdominal:     General: Bowel sounds are normal.  Lymphadenopathy:     Cervical: No cervical adenopathy.  Neurological:     Mental Status: He is alert.  Psychiatric:        Mood and Affect: Mood normal.        Behavior: Behavior normal.        Thought Content: Thought content normal.        Judgment: Judgment normal.    BP (!) 162/98   Pulse 84   Temp 98.1 F (36.7 C)   Resp 16   Ht 6\' 5"  (1.956 m)   Wt (!) 316 lb (143.3 kg)   SpO2 97%   BMI 37.47 kg/m  Wt Readings from Last 3 Encounters:  04/07/21 (!) 316 lb (143.3 kg)  02/26/21 (!) 312 lb (141.5 kg)  11/23/20 298 lb (135.2 kg)    Health Maintenance Due  Topic Date Due   COVID-19 Vaccine (1) Never done   COLONOSCOPY (Pts 45-60yrs Insurance coverage will need to be confirmed)  Never done   Zoster Vaccines- Shingrix (1 of 2) Never done    There are no preventive care reminders to display for this patient.   No results found for: TSH Lab Results  Component Value Date   WBC 4.9 01/28/2020   HGB 14.4 01/28/2020   HCT 41.8 01/28/2020   MCV 97.4 01/28/2020   PLT 147 (L) 01/28/2020   Lab Results  Component Value Date   NA 141 01/28/2020   K 4.3 01/28/2020   CO2 24 01/28/2020   GLUCOSE 124 (H) 01/28/2020   BUN 21 (H) 01/28/2020   CREATININE 1.38 (H) 01/28/2020   BILITOT 0.6 02/28/2019   ALKPHOS 57 02/28/2019   AST 22 02/28/2019   ALT 35 02/28/2019   PROT 6.8 02/28/2019   ALBUMIN 4.3 02/28/2019   CALCIUM 9.1 01/28/2020   ANIONGAP 11 01/28/2020   GFR 66.76 02/28/2019   Lab Results  Component Value Date   CHOL 119 02/28/2019   Lab Results  Component Value Date   HDL 41.20 02/28/2019   Lab Results  Component Value Date   LDLCALC 62 02/28/2019   Lab Results  Component Value Date   TRIG 76.0 02/28/2019   Lab Results  Component Value Date   CHOLHDL 3 02/28/2019   No results found for: HGBA1C     Assessment & Plan:    Problem List Items Addressed This Visit       Respiratory  Influenza A    Patient's POCT tests came back positive for influenza A.  Patient is past treatment window.  Given that he is on immune modifying medications we did discuss about starting antiviral anyway did review side effect profile.  Patient states hold off and see how he does.  He is having some shortness of breath and chest tightness has a prescription for steroids it was called in on 04-02-2021.  He has not picked up strongly encouraged him to pick that up that will help with the chest tightness and shortness of breath.  Continue to monitor        Other   Acute cough - Primary    Continue using over-the-counter treatment regimens.  Continue to monitor      Relevant Orders   Influenza A/B (Completed)   Other headache syndrome    Due to influenza infection.  Continue to monitor      Relevant Orders   Influenza A/B (Completed)     No orders of the defined types were placed in this encounter.  This visit occurred during the SARS-CoV-2 public health emergency.  Safety protocols were in place, including screening questions prior to the visit, additional usage of staff PPE, and extensive cleaning of exam room while observing appropriate contact time as indicated for disinfecting solutions.   [name], NP

## 2021-04-07 NOTE — Assessment & Plan Note (Signed)
Continue using over-the-counter treatment regimens.  Continue to monitor

## 2021-08-30 IMAGING — DX DG HIP (WITH OR WITHOUT PELVIS) 2-3V*L*
3 series · 3 of 3 positions shown · non-contrast
Comparison: None.

CLINICAL DATA: Left hip pain.  Sciatica.

EXAM:
DG HIP (WITH OR WITHOUT PELVIS) 2-3V LEFT

[pelvis ap]
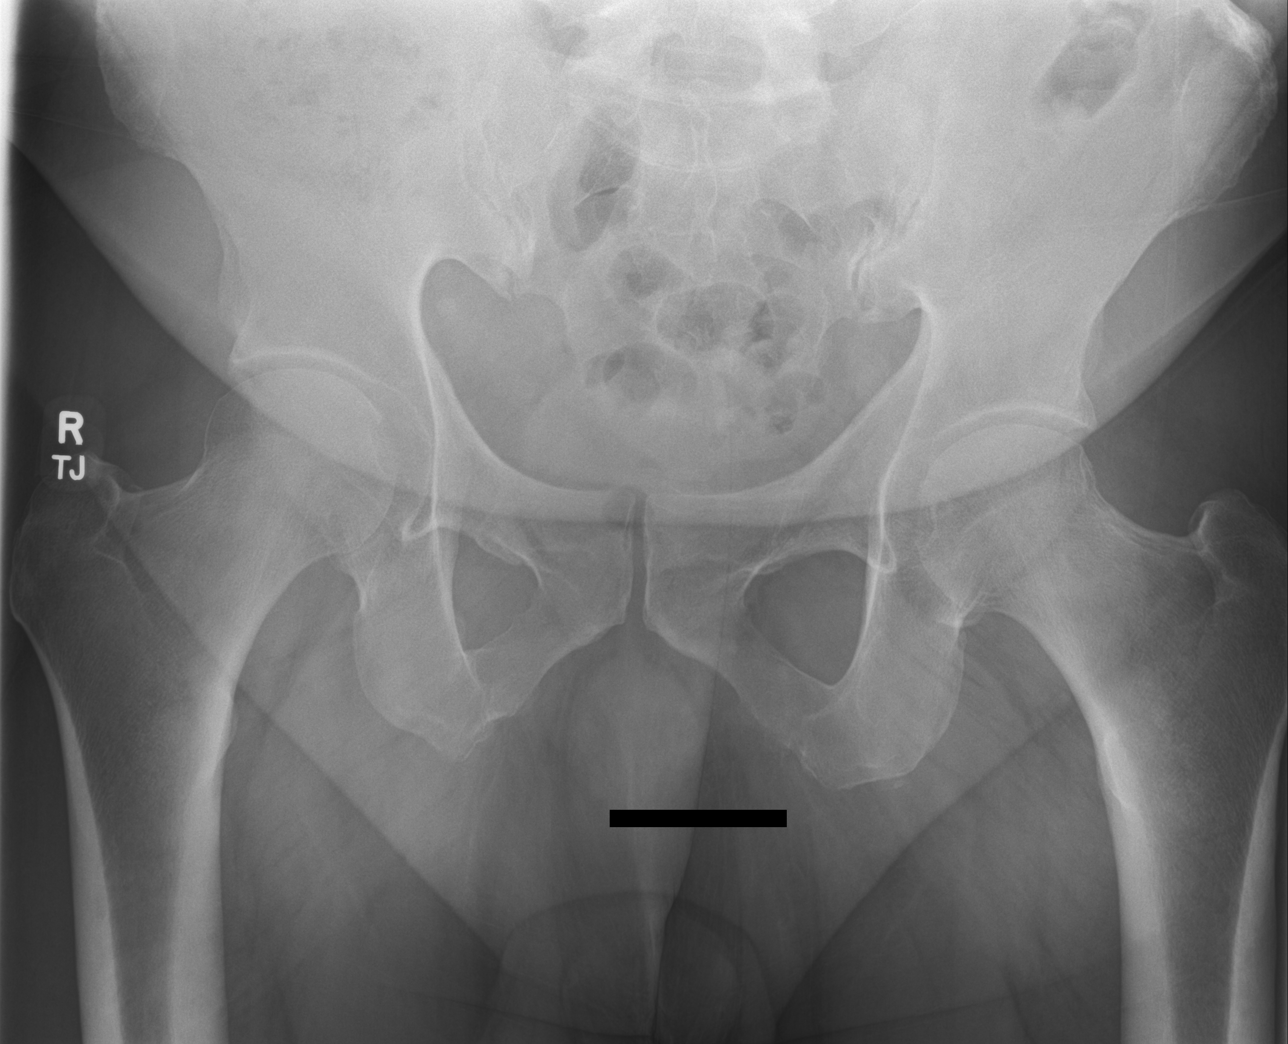

[hip ap]
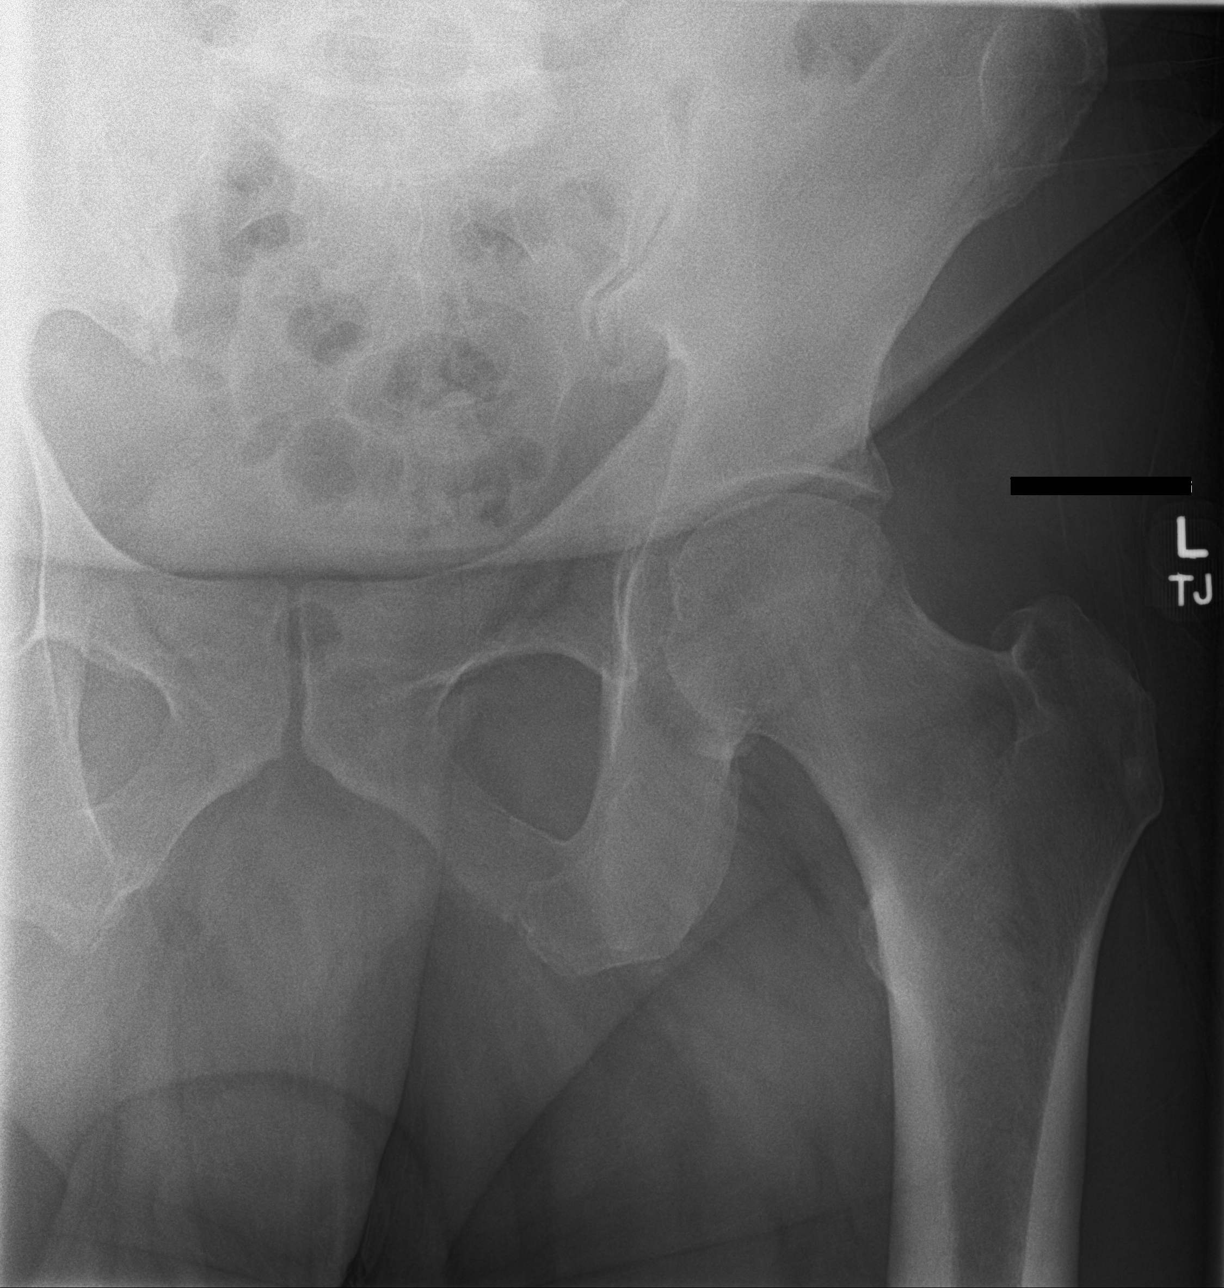

[hip lat]
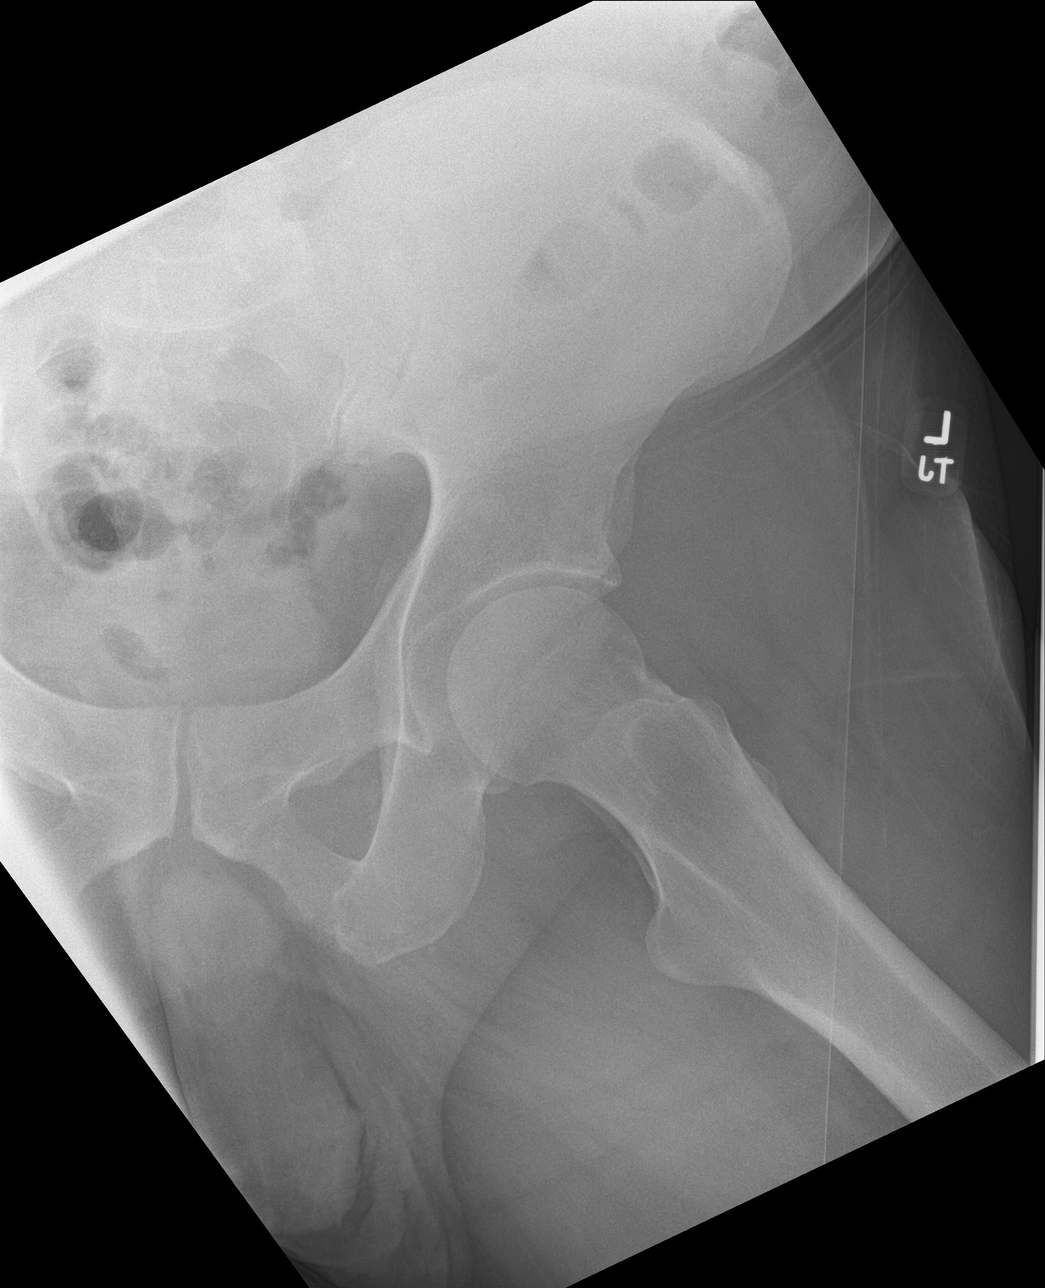

[3 of 3 positions shown; findings below may reference images not displayed]

FINDINGS: There is no evidence of hip fracture or dislocation. There is no
evidence of arthropathy or other focal bone abnormality.
IMPRESSION: Negative.

## 2021-08-30 IMAGING — DX DG LUMBAR SPINE COMPLETE 4+V
5 series · 5 of 5 positions shown · non-contrast
Comparison: August 02, 2008

CLINICAL DATA: Left hip pain.  Sciatica.

EXAM:
LUMBAR SPINE - COMPLETE 4+ VIEW

[l-spine ap]
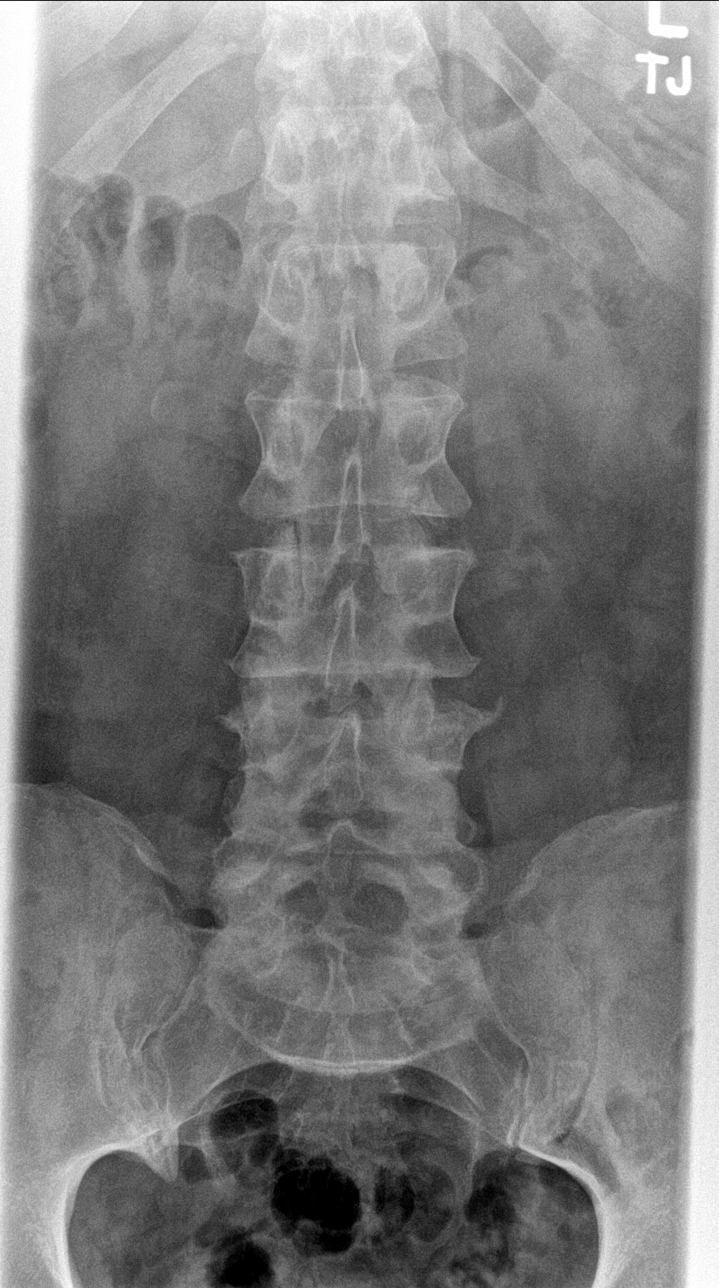

[l-spine obl (1 of 2)]
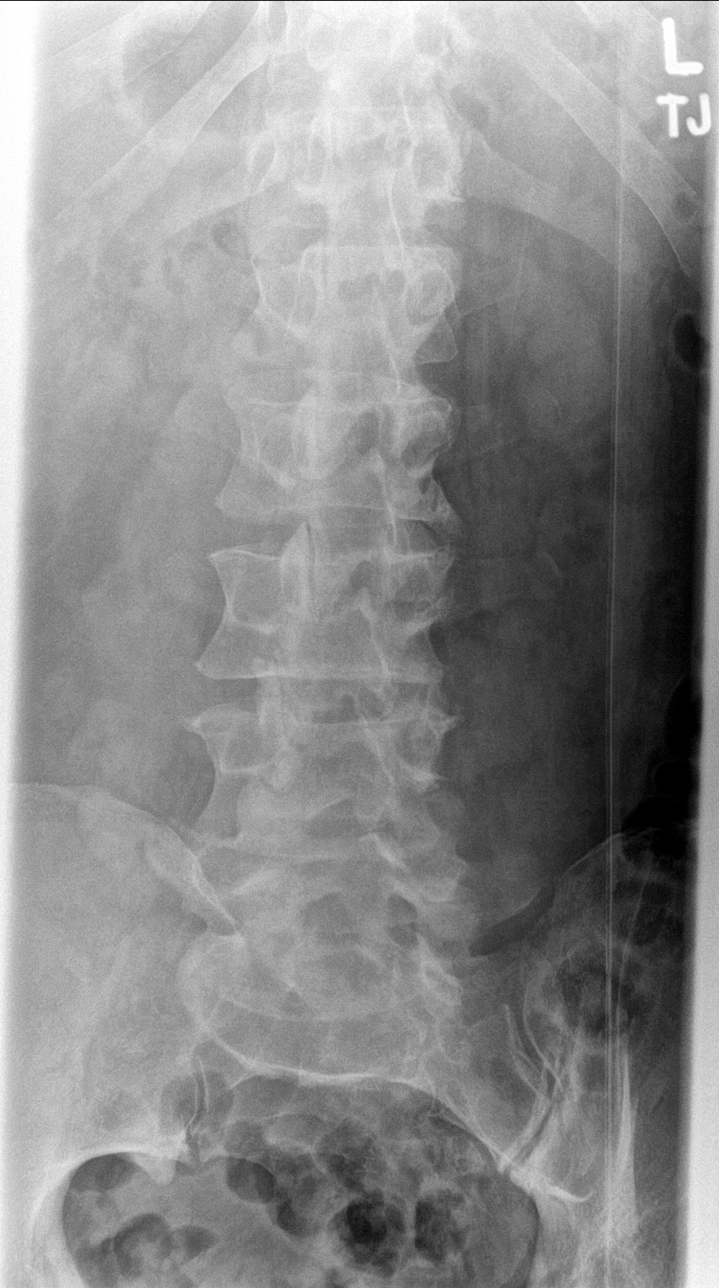

[l-spine obl (2 of 2)]
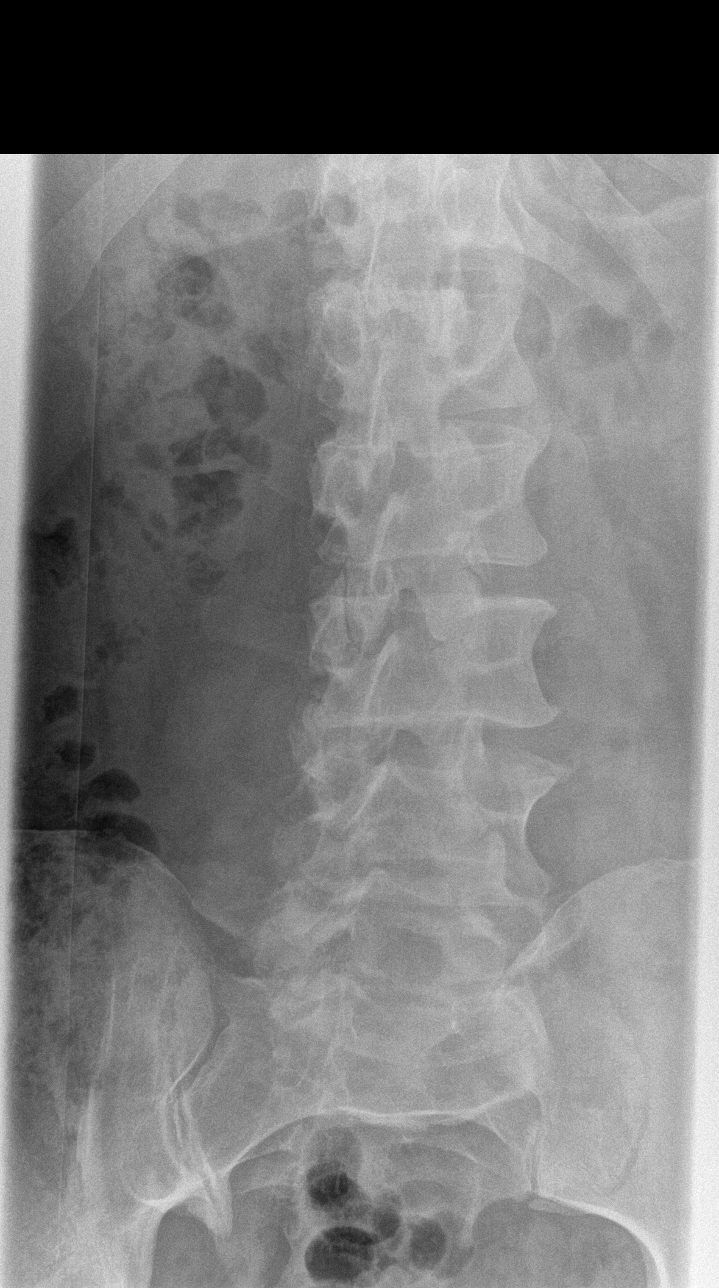

[l-spine lat]
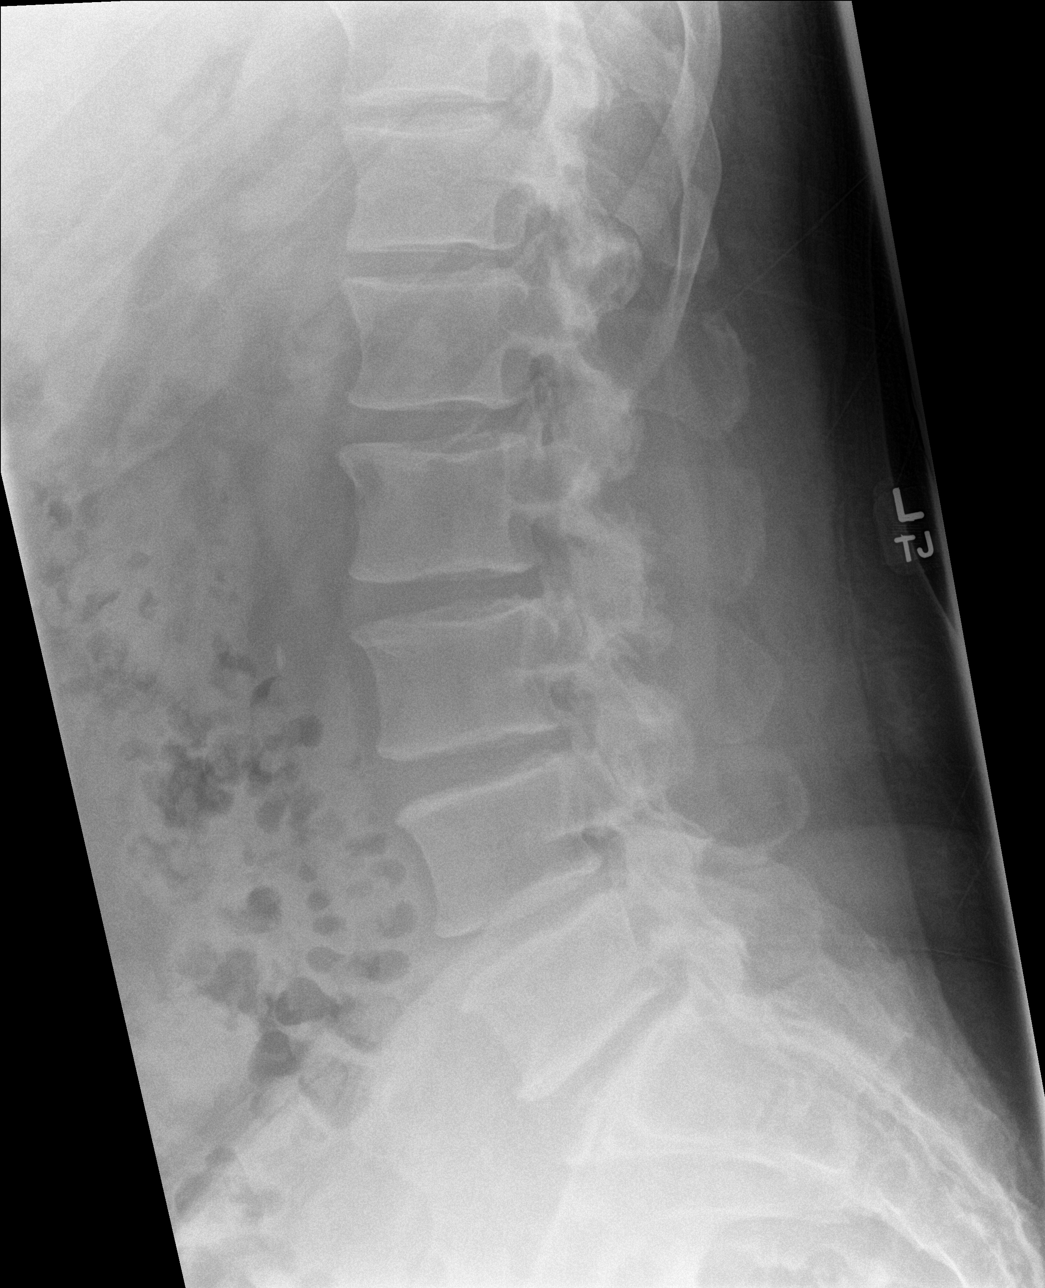

[l-spine l5/s1]
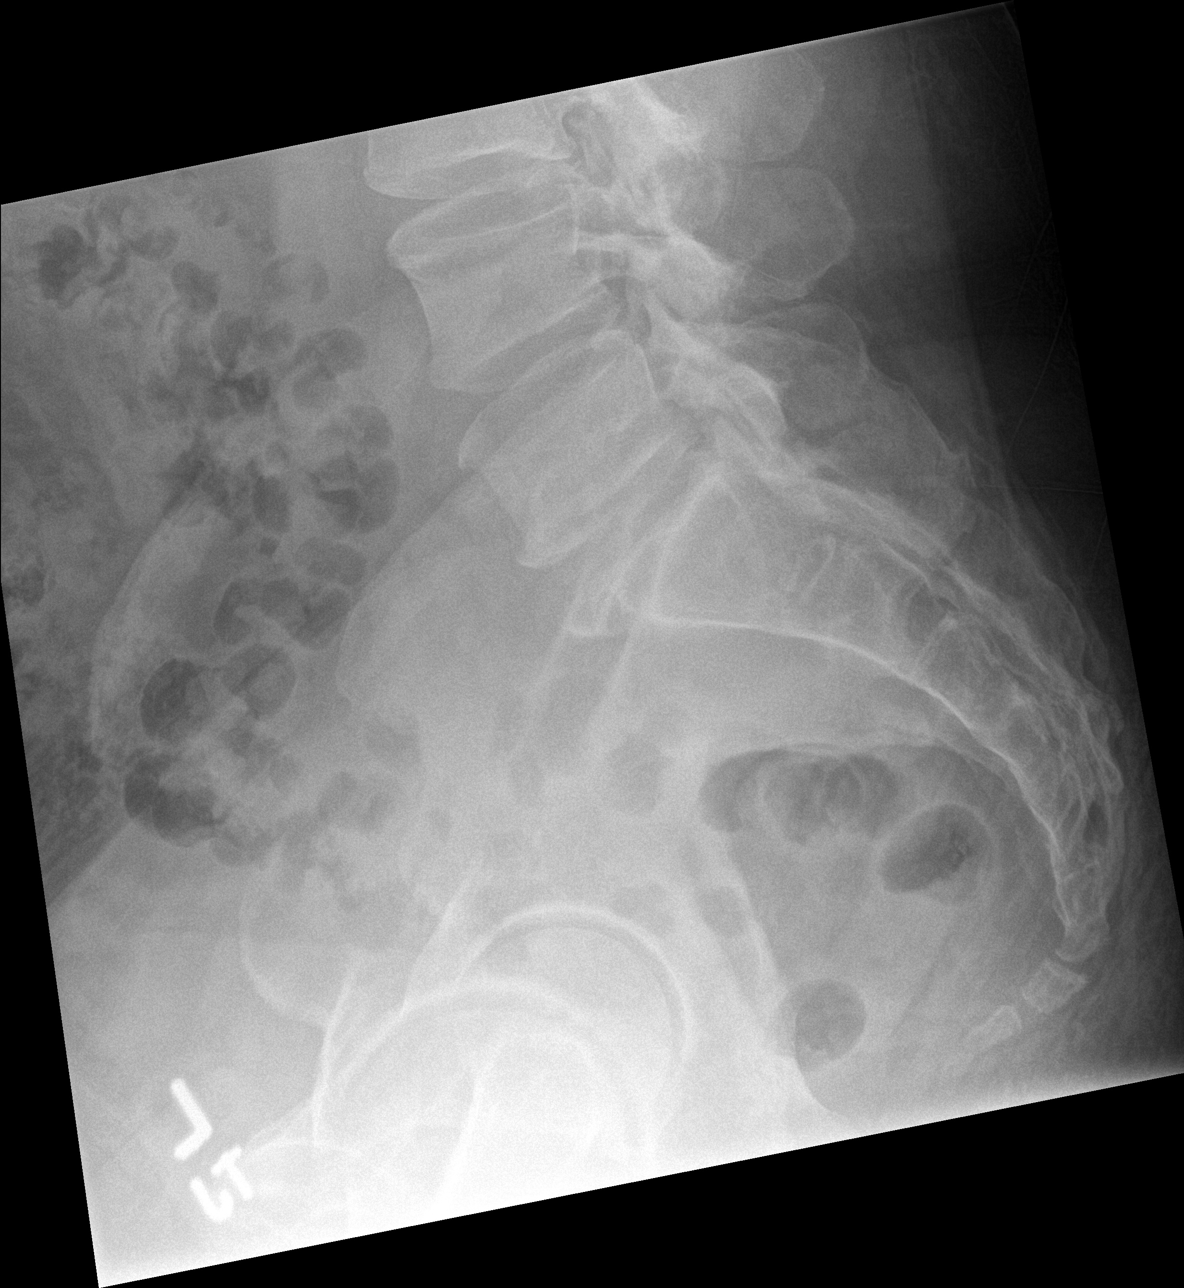

[5 of 5 positions shown; findings below may reference images not displayed]

FINDINGS: No fracture or traumatic malalignment. Lumbar facet degenerative
changes. Mild multilevel degenerative disc disease with tiny
anterior osteophytes at L4-5 and L5-S1. No other acute
abnormalities.
IMPRESSION: Degenerative changes as above.  No other acute abnormalities.

## 2021-10-04 ENCOUNTER — Ambulatory Visit: Payer: 59 | Admitting: Nurse Practitioner

## 2021-10-04 VITALS — BP 152/94 | HR 75 | Temp 97.6°F | Resp 14 | Wt 306.0 lb

## 2021-10-04 DIAGNOSIS — H6122 Impacted cerumen, left ear: Secondary | ICD-10-CM | POA: Insufficient documentation

## 2021-10-04 NOTE — Assessment & Plan Note (Signed)
Verbal consent obtained patient had cerumen softening drops placed in left ear.  Per office policy mixture of water and hydroperoxide was prepared and left ear irrigated.  Partial cerumen impaction dislodged.  Unable to clear entire impaction.  Patient did notice an increase in his ability to hear we will have patient get over-the-counter Debrox or similar eardrops follow instructions on bottle do it over the next 3 days and then follow-up in office for further irrigation.  If no success will send to ENT at that point ?

## 2021-10-04 NOTE — Patient Instructions (Signed)
Get some over the counter Debrox or similar eax wax softening drops. Follow the directions on the bottle and then follow up in 3 days and we will try and irrigate the ear again. ? ?If  the ear starts hurting, bleeding, or discharge comes out let us know.  ?

## 2021-10-04 NOTE — Progress Notes (Signed)
? ?  Acute Office Visit ? ?Subjective:  ? ?  ?Patient ID: Mike Foster, male    DOB: 10-05-1965, 56 y.o.   MRN: [id number] ? ?Chief Complaint  ?Patient presents with  ? Ear Fullness  ?  Not able to hear well, feels like ears are stopped up since April 27th. No ringing or hurting.  ? ? ? ?Patient is in today for ear fullness/decreased hearing ? ?States that he noticed it on 09/23/2021. Both sides that happened all of sudden. Does not wear ear plugs. Does not work in a loud eniroment or use ear plugs. No head phones. 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 No ENT referral  ?States that he did try 3% hydrogen proxide and water flush without relief  ? ?Review of Systems  ?HENT:  Negative for ear discharge and ear pain.   ?     Decreased hearing ?  ? ? ?   ?Objective:  ?  ?BP (!) 152/94   Pulse 75   Temp 97.6 ?F (36.4 ?C)   Resp 14   Wt (!) 306 lb (138.8 kg)   SpO2 99%   BMI 36.29 kg/m?  ? ? ?Physical Exam ?Vitals and nursing note reviewed.  ?Constitutional:   ?   Appearance: Normal appearance.  ?HENT:  ?   Right Ear: Tympanic membrane, ear canal and external ear normal.  ?   Left Ear: Ear canal and external ear normal. There is impacted cerumen.  ?   Mouth/Throat:  ?   Mouth: Mucous membranes are moist.  ?   Pharynx: Oropharynx is clear.  ?Cardiovascular:  ?   Rate and Rhythm: Normal rate and regular rhythm.  ?   Heart sounds: Normal heart sounds.  ?Pulmonary:  ?   Effort: Pulmonary effort is normal.  ?   Breath sounds: Normal breath sounds.  ?Lymphadenopathy:  ?   Cervical: No cervical adenopathy.  ?Neurological:  ?   Mental Status: He is alert.  ? ? ?No results found for any visits on 10/04/21. ? ? ?   ?Assessment & Plan:  ? ?Problem List Items Addressed This Visit   ? ?  ? Nervous and Auditory  ? Hearing loss of left ear due to cerumen impaction - Primary  ?  Verbal consent obtained patient had cerumen softening drops placed in left ear.  Per office policy mixture of water and  hydroperoxide was prepared and left ear irrigated.  Partial cerumen impaction dislodged.  Unable to clear entire impaction.  Patient did notice an increase in his ability to hear we will have patient get over-the-counter Debrox or similar eardrops follow instructions on bottle do it over the next 3 days and then follow-up in office for further irrigation.  If no success will send to ENT at that point ? ?  ?  ? Relevant Orders  ? Ear Lavage  ? ? ?No orders of the defined types were placed in this encounter. ? ? ?Return in about 3 days (around 10/07/2021) for Cerumen removal . ? ?[name], NP ? ? ?

## 2021-10-08 ENCOUNTER — Ambulatory Visit: Payer: 59 | Admitting: Nurse Practitioner

## 2021-10-08 VITALS — BP 160/98 | HR 77 | Temp 97.7°F | Resp 16 | Wt 312.0 lb

## 2021-10-08 DIAGNOSIS — H6122 Impacted cerumen, left ear: Secondary | ICD-10-CM

## 2021-10-08 DIAGNOSIS — I10 Essential (primary) hypertension: Secondary | ICD-10-CM

## 2021-10-08 NOTE — Assessment & Plan Note (Signed)
Verbal consent was obtained.  A mixture of water and hydroperoxide was used per office policy patient's left ear was irrigated.  Impaction was removed successfully at this time patient's hearing seems to be normal per his report.  TM within normal limits bilaterally ?

## 2021-10-08 NOTE — Progress Notes (Signed)
? ?  Acute Office Visit ? ?Subjective:  ? ?  ?Patient ID: Mike Foster, male    DOB: 1965/10/17, 56 y.o.   MRN: [id number] ? ?Chief Complaint  ?Patient presents with  ? Cerumen Impaction  ? ? ?HPI ?Patient is in today for cerumen impaction. ? ?Patient was seen on 10/04/2021 with hearing loss due to cerumen impaction.  Unsuccessful in fully disimpacting patient's left ear.  Patient was given directions to purchase over-the-counter cerumen softening eardrops and use it for the next several days and return to clinic to see if we can remove the disimpaction. ? ?Review of Systems  ?Constitutional:  Negative for chills and fever.  ?HENT:  Negative for ear discharge and ear pain.   ?Cardiovascular:  Negative for chest pain.  ? ? ?   ?Objective:  ?  ?BP (!) 160/98   Pulse 77   Temp 97.7 ?F (36.5 ?C)   Resp 16   Wt (!) 312 lb (141.5 kg)   SpO2 98%   BMI 37.00 kg/m?  ? ? ?Physical Exam ?Vitals and nursing note reviewed.  ?Constitutional:   ?   Appearance: Normal appearance. He is obese.  ?HENT:  ?   Right Ear: Tympanic membrane, ear canal and external ear normal. There is no impacted cerumen.  ?   Left Ear: Tympanic membrane, ear canal and external ear normal. There is no impacted cerumen.  ?Cardiovascular:  ?   Rate and Rhythm: Normal rate and regular rhythm.  ?   Heart sounds: Normal heart sounds.  ?Pulmonary:  ?   Effort: Pulmonary effort is normal.  ?   Breath sounds: Normal breath sounds.  ?Neurological:  ?   Mental Status: He is alert.  ? ? ?No results found for any visits on 10/08/21. ? ? ?   ?Assessment & Plan:  ? ?Problem List Items Addressed This Visit   ? ?  ? Cardiovascular and Mediastinum  ? HYPERTENSION, CONTROLLED  ?  Patient is seen by me the past 3 visits.  Each visit blood pressures been slightly above goal.  Patient above goal today and upon recheck it was no better.  Patient states he is on lisinopril 20 mg taking as prescribed will reach out to PCP for further management in regards to either doubling  lisinopril or putting patient on HCTZ.  Encourage patient to check blood pressure outside of office and record ? ?  ?  ?  ? Nervous and Auditory  ? Hearing loss of left ear due to cerumen impaction - Primary  ?  Verbal consent was obtained.  A mixture of water and hydroperoxide was used per office policy patient's left ear was irrigated.  Impaction was removed successfully at this time patient's hearing seems to be normal per his report.  TM within normal limits bilaterally ? ?  ?  ? ? ?No orders of the defined types were placed in this encounter. ? ? ?No follow-ups on file. ? ?[name], NP ? ? ?

## 2021-10-08 NOTE — Assessment & Plan Note (Signed)
Patient is seen by me the past 3 visits.  Each visit blood pressures been slightly above goal.  Patient above goal today and upon recheck it was no better.  Patient states he is on lisinopril 20 mg taking as prescribed will reach out to PCP for further management in regards to either doubling lisinopril or putting patient on HCTZ.  Encourage patient to check blood pressure outside of office and record ?

## 2021-10-08 NOTE — Patient Instructions (Signed)
Nice to see you today ?I am going to reach out to Dr. Damita Dunnings about your blood pressure and what next steps he wants to do ?If you can check it a couple times a week and write down your number ? ?

## 2021-10-10 ENCOUNTER — Telehealth: Payer: Self-pay | Admitting: Family Medicine

## 2021-10-10 MED ORDER — AMLODIPINE BESYLATE 2.5 MG PO TABS: 2.5000 mg | ORAL_TABLET | Freq: Every day | ORAL | 0 refills | Status: DC

## 2021-10-10 NOTE — Telephone Encounter (Signed)
Please call patient.  Blood pressure has been elevated here in clinic.  If his blood pressure stays elevated at home then I would start taking amlodipine 2.5 mg a day in addition to his current medications.  Please see about getting him scheduled for a CPE when possible.  I preemptively sent the prescription for amlodipine.  Thanks. ?

## 2021-10-12 NOTE — Telephone Encounter (Signed)
Called patient and discussed below. Patient has not been able to take BP at home as he no longer has BP cuff at home. He will go ahead and start the medication and scheduled his CPE for 10/28/21 at 8:00 am. ?

## 2021-10-25 ENCOUNTER — Other Ambulatory Visit: Payer: Self-pay | Admitting: Family Medicine

## 2021-10-28 ENCOUNTER — Ambulatory Visit: Payer: 59 | Admitting: Family Medicine

## 2021-10-28 ENCOUNTER — Encounter: Payer: Self-pay | Admitting: Family Medicine

## 2021-10-28 VITALS — BP 152/86 | HR 82 | Temp 97.9°F | Ht 77.0 in | Wt 307.0 lb

## 2021-10-28 DIAGNOSIS — Z7189 Other specified counseling: Secondary | ICD-10-CM

## 2021-10-28 DIAGNOSIS — M5430 Sciatica, unspecified side: Secondary | ICD-10-CM

## 2021-10-28 DIAGNOSIS — Z0000 Encounter for general adult medical examination without abnormal findings: Secondary | ICD-10-CM | POA: Diagnosis not present

## 2021-10-28 DIAGNOSIS — Z1211 Encounter for screening for malignant neoplasm of colon: Secondary | ICD-10-CM | POA: Diagnosis not present

## 2021-10-28 DIAGNOSIS — M109 Gout, unspecified: Secondary | ICD-10-CM | POA: Diagnosis not present

## 2021-10-28 DIAGNOSIS — L4050 Arthropathic psoriasis, unspecified: Secondary | ICD-10-CM

## 2021-10-28 DIAGNOSIS — I10 Essential (primary) hypertension: Secondary | ICD-10-CM

## 2021-10-28 LAB — BASIC METABOLIC PANEL
BUN: 24 mg/dL — ABNORMAL HIGH (ref 6–23)
CO2: 26 mEq/L (ref 19–32)
Calcium: 9.5 mg/dL (ref 8.4–10.5)
Chloride: 106 mEq/L (ref 96–112)
Creatinine, Ser: 1.33 mg/dL (ref 0.40–1.50)
GFR: 59.39 mL/min — ABNORMAL LOW (ref >60.00)
Glucose, Bld: 102 mg/dL — ABNORMAL HIGH (ref 70–99)
Potassium: 4.3 mEq/L (ref 3.5–5.1)
Sodium: 141 mEq/L (ref 135–145)

## 2021-10-28 LAB — URIC ACID: Uric Acid, Serum: 7.3 mg/dL (ref 4.0–7.8)

## 2021-10-28 LAB — LIPID PANEL
Cholesterol: 131 mg/dL (ref 0–200)
HDL: 50.2 mg/dL (ref >39.00)
LDL Cholesterol: 68 mg/dL (ref 0–99)
NonHDL: 80.73
Total CHOL/HDL Ratio: 3
Triglycerides: 62 mg/dL (ref 0.0–149.0)
VLDL: 12.4 mg/dL (ref 0.0–40.0)

## 2021-10-28 MED ORDER — AMLODIPINE BESYLATE 2.5 MG PO TABS: 5.0000 mg | ORAL_TABLET | Freq: Every day | ORAL | Status: DC

## 2021-10-28 NOTE — Progress Notes (Signed)
CPE- See plan.  Routine anticipatory guidance given to patient.  See health maintenance.  The possibility exists that previously documented standard health maintenance information may have been brought forward from a previous encounter into this note.  If needed, that same information has been updated to reflect the current situation based on today's encounter.    Tetanus 2019 Flu prev done.  PNA d/w pt.   Shingles d/w pt.   Covid vaccine d/w pt.   D/w patient [id number] for colon cancer screening, including IFOB vs. colonoscopy.  Risks and benefits of both were discussed and patient voiced understanding.  Pt elects for: IFOB.   Prostate cancer screening and PSA options (with potential risks and benefits of testing vs not testing) were discussed along with recent recs/guidelines.  He declined testing PSA at this point. Diet and exercise d/w pt.  Encouraged both.  Exercise mainly at work. Living will d/w pt.  Brother Kasandra Knudsen designated if patient were incapacitated.  Still on allopurinol, no recent flares of gout.  Labs pending.  D/w pt.    Hypertension:    Using medication without problems or lightheadedness: yes Chest pain with exertion:no Edema:no Short of breath: only with sig exertion and that is at baseline.   On lisinopril with amlodipine added.    He changed to simponi.  His joint pain is better but he sweats more with med use.  Still on MTX at baseline.    He had injection for sciatica recently.  His pain is some better but not resolved, he has f/u pending.    PMH and SH reviewed  Meds, vitals, and allergies reviewed.   ROS: Per HPI.  Unless specifically indicated otherwise in HPI, the patient denies:  General: fever. Eyes: acute vision changes ENT: sore throat Cardiovascular: chest pain Respiratory: SOB GI: vomiting GU: dysuria Musculoskeletal: acute back pain Derm: acute rash Neuro: acute motor dysfunction Psych: worsening mood Endocrine: polydipsia Heme:  bleeding Allergy: hayfever  GEN: nad, alert and oriented HEENT: mucous membranes moist NECK: supple w/o LA CV: rrr. PULM: ctab, no inc wob ABD: soft, +bs EXT: no edema SKIN: no acute rash, benign appearing 1cm x 0.5cm SK noted on the R upper chest wall.

## 2021-10-28 NOTE — Patient Instructions (Signed)
Go to the lab on the way out.   If you have mychart we'll likely use that to update you.    Take care.  Glad to see you. It looks like you have a benign seborrheic keratosis on the chest.  Update me as needed.   Increase the amlodipine to 2 tabs a day (5mg ).  You can take 2 of the 2.5mg  tabs at the same time. Let me know about your BP in about 2 weeks.  I can send in a new rx for 5mg  if needed.  We may still need to change the dose if your BP isn't controlled.

## 2021-10-31 DIAGNOSIS — M109 Gout, unspecified: Secondary | ICD-10-CM | POA: Insufficient documentation

## 2021-10-31 NOTE — Assessment & Plan Note (Signed)
Still on allopurinol, no recent flares of gout.  Labs pending.  D/w pt. continue allopurinol as is.

## 2021-10-31 NOTE — Assessment & Plan Note (Signed)
He had injection for sciatica recently.  His pain is some better but not resolved, he has f/u pending.

## 2021-10-31 NOTE — Assessment & Plan Note (Signed)
Tetanus 2019 Flu prev done.  PNA d/w pt.   Shingles d/w pt.   Covid vaccine d/w pt.   D/w patient [id number] for colon cancer screening, including IFOB vs. colonoscopy.  Risks and benefits of both were discussed and patient voiced understanding.  Pt elects for: IFOB.   Prostate cancer screening and PSA options (with potential risks and benefits of testing vs not testing) were discussed along with recent recs/guidelines.  He declined testing PSA at this point. Diet and exercise d/w pt.  Encouraged both.  Exercise mainly at work. Living will d/w pt.  Brother Kasandra Knudsen designated if patient were incapacitated.

## 2021-10-31 NOTE — Assessment & Plan Note (Signed)
Living will d/w pt.  Brother Kasandra Knudsen designated if patient were incapacitated.

## 2021-10-31 NOTE — Assessment & Plan Note (Signed)
See notes on labs On lisinopril with amlodipine added.   Increase amlodipine to 5 mg (2 x 2.5 mg tabs) and he can update me if his blood pressure is not improved/controlled.

## 2021-10-31 NOTE — Assessment & Plan Note (Signed)
Per outside clinic.  I will defer. 

## 2021-11-07 ENCOUNTER — Encounter (HOSPITAL_COMMUNITY): Payer: Self-pay

## 2021-11-07 ENCOUNTER — Other Ambulatory Visit: Payer: Self-pay

## 2021-11-07 ENCOUNTER — Emergency Department (HOSPITAL_COMMUNITY)
Admission: EM | Admit: 2021-11-07 | Discharge: 2021-11-07 | Disposition: A | Discharge status: Home / Self Care | Payer: 59 | Attending: Emergency Medicine | Admitting: Emergency Medicine

## 2021-11-07 DIAGNOSIS — I10 Essential (primary) hypertension: Secondary | ICD-10-CM | POA: Diagnosis not present

## 2021-11-07 DIAGNOSIS — R220 Localized swelling, mass and lump, head: Secondary | ICD-10-CM | POA: Diagnosis present

## 2021-11-07 DIAGNOSIS — K1379 Other lesions of oral mucosa: Secondary | ICD-10-CM | POA: Diagnosis not present

## 2021-11-07 DIAGNOSIS — Z79899 Other long term (current) drug therapy: Secondary | ICD-10-CM | POA: Diagnosis not present

## 2021-11-07 LAB — GROUP A STREP BY PCR: Group A Strep by PCR: NOT DETECTED

## 2021-11-07 MED ORDER — DEXAMETHASONE 4 MG PO TABS
10.0000 mg | ORAL_TABLET | Freq: Once | ORAL | Status: AC
Start: 2021-11-07 — End: 2021-11-07
  Administered 2021-11-07: 10 mg via ORAL
  Filled 2021-11-07: qty 3

## 2021-11-07 NOTE — ED Provider Notes (Signed)
[facility] Provider Note   CSN: [id number] Arrival date & time: 11/07/21  0518     History  Chief Complaint  Patient presents with   Oral Swelling    Mike Foster is a 56 y.o. male.  HPI Patient presents for concern of swollen uvula.  Medical history includes HTN, seizure, psoriatic arthritis, gout.  He reports that he was in his normal state of health last night.  He does know that he snores.  When he woke up this morning, he had a sensation of swelling in his throat.  When he looked in the mirror, he noticed that his uvula was greater in size than normal.  He has not had any difficulty breathing or swallowing today.  He denies any current pain.  He has not started on any new medications.  He has not had any known exposure to allergens.    Home Medications Prior to Admission medications   Medication Sig Start Date End Date Taking? Authorizing Provider  allopurinol (ZYLOPRIM) 100 MG tablet Take 100 mg by mouth daily. 08/14/21  Yes [provider]  amLODipine (NORVASC) 2.5 MG tablet Take 2 tablets (5 mg total) by mouth daily. 10/28/21  Yes Joaquim Namuncan, Graham S, MD  folic acid (FOLVITE) 1 MG tablet Take 1 mg by mouth daily.   Yes [provider]  golimumab (SIMPONI ARIA) 50 MG/4ML SOLN injection Through an iv   Yes [provider]  lisinopril (ZESTRIL) 20 MG tablet TAKE 1 TABLET(20 MG) BY MOUTH DAILY Patient taking differently: Take 20 mg by mouth daily. 10/26/21  Yes Joaquim Namuncan, Graham S, MD  methotrexate (RHEUMATREX) 2.5 MG tablet Take 6 tablets (15 mg total) by mouth once a week. Caution:Chemotherapy. Protect from light. 10/02/20  Yes Joaquim Namuncan, Graham S, MD      Allergies    Gabapentin    Review of Systems   Review of Systems  HENT:  Negative for congestion, drooling, facial swelling, trouble swallowing and voice change.   All other systems reviewed and are negative.   Physical Exam Updated Vital Signs BP (!) 150/92    Pulse 73   Temp (!) 97.5 F (36.4 C) (Temporal)   Resp 16   Ht 6\' 5"  (1.956 m)   Wt (!) 139.3 kg   SpO2 99%   BMI 36.40 kg/m  Physical Exam Vitals and nursing note reviewed.  Constitutional:      General: He is not in acute distress.    Appearance: Normal appearance. He is well-developed and normal weight. He is not ill-appearing, toxic-appearing or diaphoretic.  HENT:     Head: Normocephalic and atraumatic.     Right Ear: External ear normal.     Left Ear: External ear normal.     Nose: Nose normal.     Mouth/Throat:     Mouth: Mucous membranes are moist.     Pharynx: Oropharynx is clear. No oropharyngeal exudate or posterior oropharyngeal erythema.     Comments: Edematous uvula Eyes:     General: No scleral icterus.    Extraocular Movements: Extraocular movements intact.     Conjunctiva/sclera: Conjunctivae normal.  Cardiovascular:     Rate and Rhythm: Normal rate and regular rhythm.     Heart sounds: No murmur heard. Pulmonary:     Effort: Pulmonary effort is normal. No respiratory distress.     Breath sounds: Normal breath sounds. No stridor. No wheezing or rales.  Abdominal:     General: There is no distension.  Palpations: Abdomen is soft.  Musculoskeletal:        General: No swelling. Normal range of motion.     Cervical back: Normal range of motion and neck supple. No rigidity or tenderness.     Right lower leg: No edema.     Left lower leg: No edema.  Skin:    General: Skin is warm and dry.     Coloration: Skin is not jaundiced or pale.  Neurological:     General: No focal deficit present.     Mental Status: He is alert and oriented to person, place, and time.     Cranial Nerves: No cranial nerve deficit.     Sensory: No sensory deficit.     Motor: No weakness.     Coordination: Coordination normal.  Psychiatric:        Mood and Affect: Mood normal.        Behavior: Behavior normal.        Thought Content: Thought content normal.        Judgment:  Judgment normal.     ED Results / Procedures / Treatments   Labs (all labs ordered are listed, but only abnormal results are displayed) Labs Reviewed  GROUP A STREP BY PCR    EKG None  Radiology No results found.  Procedures Procedures    Medications Ordered in ED Medications  dexamethasone (DECADRON) tablet 10 mg (10 mg Oral Given 11/07/21 0736)    ED Course/ Medical Decision Making/ A&P                           Medical Decision Making Risk Prescription drug management.   This patient presents to the ED for concern of uvula swelling, this involves an extensive number of treatment options, and is a complaint that carries with it a high risk of complications and morbidity.  The differential diagnosis includes angioedema, other allergic reaction, URI, trauma from snoring   Co morbidities that complicate the patient evaluation  HTN, seizure, psoriatic arthritis, gout   Additional history obtained:  Additional history obtained from N/A External records from outside source obtained and reviewed including EMR   Lab Tests:  I Ordered, and personally interpreted labs.  The pertinent results include: Negative strep   Problem List / ED Course / Critical interventions / Medication management  Patient is a 56 year old male presenting for uvula swelling.  This was noticed this morning when he woke up.  He has felt a sensation that his uvula is swollen and did identify this in the mirror.  He has otherwise not had any symptoms.  He denies any current pain, difficulty swallowing, voice change, or difficulty breathing.  On assessment, patient is well-appearing.  His initial vital signs are notable for hypertension.  He has not taken his morning medications.  Patient has never had an angioedema reaction in the past.  He has not been started on any new medications.  He is on lisinopril but this has been a long-term medication for him.  Currently, swelling is isolated to the uvula  and angioedema is not suspected.  He was in his normal state of health last night.  In the absence of any indicators of allergic reaction or infection, I suspect that the patient's uvula is swollen secondary to snoring overnight, which patient is known to do.  Patient was given dose of Decadron to minimize inflammation and swelling.  He was advised to return to the emergency department  if he experiences any worsening of symptoms.  He is otherwise stable for discharge and was provided follow-up information for ENT.  He was also advised to follow-up with his primary care doctor for sleep study and possible therapeutic interventions for possible OSA. I ordered medication including Decadron for uvula swelling Reevaluation of the patient after these medicines showed that the patient stayed the same I have reviewed the patients home medicines and have made adjustments as needed   Social Determinants of Health:  Has access to outpatient care   Test / Admission - Considered:  Consider continued observation in the ED, however, patient has not had any worsening of symptoms since he woke up.  Inciting event appears to be while he was sleeping and is likely secondary to trauma from his snoring.  Patient is reliable and was advised to return for any worsening of symptoms today.         Final Clinical Impression(s) / ED Diagnoses Final diagnoses:  Uvular swelling    Rx / DC Orders ED Discharge Orders     None         Gloris Manchester, MD 11/07/21 [phone]

## 2021-11-07 NOTE — ED Notes (Signed)
Reviewed discharge instructions with patient. Follow-up care reviewed. Patient verbalized understanding. Patient A&Ox4, VSS, and ambulatory with steady gait upon discharge.  

## 2021-11-07 NOTE — Discharge Instructions (Signed)
You received a steroid in the emergency department that should limit swelling and inflammation over the next 2 days.  Please return to the emergency department if you experience any worsening of symptoms.  There is also a telephone number below that you can call for an ear, nose, and throat follow-up appointment.  Speak with your primary care doctor for scheduling of sleep study.

## 2021-11-07 NOTE — ED Triage Notes (Signed)
Pt reported to ED with c/o sore throat that woke him up this morning. States that when he looked in mirror he saw that his uvula was enlarged.

## 2021-11-11 ENCOUNTER — Other Ambulatory Visit (INDEPENDENT_AMBULATORY_CARE_PROVIDER_SITE_OTHER): Payer: 59

## 2021-11-11 DIAGNOSIS — Z1211 Encounter for screening for malignant neoplasm of colon: Secondary | ICD-10-CM | POA: Diagnosis not present

## 2021-11-12 LAB — FECAL OCCULT BLOOD, IMMUNOCHEMICAL: Fecal Occult Bld: NEGATIVE

## 2022-01-18 ENCOUNTER — Other Ambulatory Visit: Payer: Self-pay | Admitting: Family Medicine

## 2022-03-22 ENCOUNTER — Telehealth: Payer: Self-pay | Admitting: Family Medicine

## 2022-03-22 NOTE — Telephone Encounter (Signed)
Will await ER note.  Thanks.  

## 2022-03-22 NOTE — Telephone Encounter (Signed)
Per access nurse note pt complied to go to ED. I spoke with pt and he is going to [facility] ED. Sending note to Dr Damita Dunnings and Janett Billow CMA.

## 2022-03-22 NOTE — Telephone Encounter (Signed)
[place] [facility] [place] [facility] - Client TELEPHONE ADVICE RECORD Access[facility]urse Patient Name: Mike Foster AN Gender: Male DOB: [birth year] Age: 56 Y 9 M 9 D Return Phone Number: [phone] (Primary) Address: City/ State/ Zip: [place] [address] Client [place] [facility] - Client Client Site [place] - Day Provider Renford Dills - MD Contact Type Call Who Is Calling Patient / Member / Family / Caregiver Call Type Triage / Clinical Relationship To Patient Self Return Phone Number [phone] (Primary) Chief Complaint FAINTING or [place] Reason for Call Symptomatic / Request for [place] states a pt is dizzy and feels like he is going to pass out. Translation No Nurse Assessment Nurse: D'Heur Lucia Gaskins, RN, Adrienne Date/Time (Eastern Time): 03/22/2022 12:17:50 PM Confirm and document reason for call. If symptomatic, describe symptoms. ---Caller states he was feeling dizzy yesterday and felt like he is going to pass out. It was worse if he moved his head quickly. He still has it today but yesterday. Today it is mild. Does the patient have any new or worsening symptoms? ---Yes Will a triage be completed? ---Yes Related visit to physician within the last 2 weeks? ---No Does the PT have any chronic conditions? (i.e. diabetes, asthma, this includes High risk factors for pregnancy, etc.) ---Yes List chronic conditions. ---allergies, arthritis Is this a behavioral health or substance abuse call? ---No Guidelines Guideline Title Affirmed Question Affirmed Notes Nurse Date/Time (Eastern Time) Dizziness - Vertigo [1] Dizziness (vertigo) present now AND [2] one or more STROKE RISK FACTORS (i.e., hypertension, diabetes, prior stroke/ TIA, heart attack) (Exception: Prior doctor or NP/PA D'Heur Lucia Gaskins, RN, [place] 03/22/2022 12:20:04 PM PLEASE NOTE: All timestamps contained within this  report are represented as [place] Standard Time. CONFIDENTIALTY NOTICE: This fax transmission is intended only for the addressee. It contains information that is legally privileged, confidential or otherwise protected from use or disclosure. If you are not the intended recipient, you are strictly prohibited from reviewing, disclosing, copying using or disseminating any of this information or taking any action in reliance on or regarding this information. If you have received this fax in error, please notify us immediately by telephone so that we can arrange for its return to [place]. Phone: [phone], Toll-Free: [phone], Fax: [phone] Page: 2 of 2 Call Id: [id number] Guidelines Guideline Title Affirmed Question Affirmed Notes Nurse Date/Time Eilene Ghazi Time) evaluation for this AND no different/ worse than usual.) Disp. Time Eilene Ghazi Time) Disposition Final User 03/22/2022 12:16:06 PM Send to Urgent Gomez Clever[place] 03/22/2022 12:22:53 PM Go to ED Now (or PCP triage) Yes D'Heur Lucia Gaskins, RN, Adrienne Final Disposition 03/22/2022 12:22:53 PM Go to ED Now (or PCP triage) Yes D'Heur Lucia Gaskins, RN, Ebbie Latus Disagree/Comply Comply Caller Understands Yes PreDisposition Call Doctor Care Advice Given Per Guideline GO TO ED NOW (OR PCP TRIAGE): * IF NO PCP (PRIMARY CARE PROVIDER) SECOND-LEVEL TRIAGE: You need to be seen within the next hour. Go to the [facility] at _____________ [place] as soon as you can. NOTE TO TRIAGER - DRIVING: * Another adult should drive. BRING MEDICINES: * Please bring a list of your current medicines when you go to see the doctor. CARE ADVICE given per Dizziness - Vertigo (Adult) guideline. Referrals [place]

## 2022-03-22 NOTE — Telephone Encounter (Signed)
Patient called in stating he has been experiencing some dizzy spells and feeling like he is going to pass out. Sent over to access nurse.

## 2022-03-24 ENCOUNTER — Ambulatory Visit: Payer: 59 | Admitting: Family Medicine

## 2022-04-05 ENCOUNTER — Emergency Department (HOSPITAL_COMMUNITY): Payer: 59

## 2022-04-05 ENCOUNTER — Other Ambulatory Visit: Payer: Self-pay

## 2022-04-05 ENCOUNTER — Emergency Department (HOSPITAL_COMMUNITY)
Admission: EM | Admit: 2022-04-05 | Discharge: 2022-04-06 | Discharge status: Against Medical Advice | Payer: 59 | Attending: Emergency Medicine | Admitting: Emergency Medicine

## 2022-04-05 ENCOUNTER — Encounter (HOSPITAL_COMMUNITY): Payer: Self-pay

## 2022-04-05 DIAGNOSIS — R42 Dizziness and giddiness: Secondary | ICD-10-CM | POA: Insufficient documentation

## 2022-04-05 DIAGNOSIS — Z5321 Procedure and treatment not carried out due to patient leaving prior to being seen by health care provider: Secondary | ICD-10-CM | POA: Insufficient documentation

## 2022-04-05 LAB — COMPREHENSIVE METABOLIC PANEL
ALT: 34 U/L (ref 0–44)
AST: 31 U/L (ref 15–41)
Albumin: 4.3 g/dL (ref 3.5–5.0)
Alkaline Phosphatase: 58 U/L (ref 38–126)
Anion gap: 12 (ref 5–15)
BUN: 16 mg/dL (ref 6–20)
CO2: 20 mmol/L — ABNORMAL LOW (ref 22–32)
Calcium: 9.3 mg/dL (ref 8.9–10.3)
Chloride: 107 mmol/L (ref 98–111)
Creatinine, Ser: 1.21 mg/dL (ref 0.61–1.24)
GFR, Estimated: >60 mL/min (ref >60)
Glucose, Bld: 98 mg/dL (ref 70–99)
Potassium: 3.9 mmol/L (ref 3.5–5.1)
Sodium: 139 mmol/L (ref 135–145)
Total Bilirubin: 1.3 mg/dL — ABNORMAL HIGH (ref 0.3–1.2)
Total Protein: 6.7 g/dL (ref 6.5–8.1)

## 2022-04-05 LAB — CBC WITH DIFFERENTIAL/PLATELET
Abs Immature Granulocytes: 0.02 10*3/uL (ref 0.00–0.07)
Basophils Absolute: 0.1 10*3/uL (ref 0.0–0.1)
Basophils Relative: 1 %
Eosinophils Absolute: 0.1 10*3/uL (ref 0.0–0.5)
Eosinophils Relative: 2 %
HCT: 42.2 % (ref 39.0–52.0)
Hemoglobin: 15.2 g/dL (ref 13.0–17.0)
Immature Granulocytes: 0 %
Lymphocytes Relative: 23 %
Lymphs Abs: 1.7 10*3/uL (ref 0.7–4.0)
MCH: 34.3 pg — ABNORMAL HIGH (ref 26.0–34.0)
MCHC: 36 g/dL (ref 30.0–36.0)
MCV: 95.3 fL (ref 80.0–100.0)
Monocytes Absolute: 0.6 10*3/uL (ref 0.1–1.0)
Monocytes Relative: 8 %
Neutro Abs: 4.8 10*3/uL (ref 1.7–7.7)
Neutrophils Relative %: 66 %
Platelets: 177 10*3/uL (ref 150–400)
RBC: 4.43 MIL/uL (ref 4.22–5.81)
RDW: 12.8 % (ref 11.5–15.5)
WBC: 7.3 10*3/uL (ref 4.0–10.5)
nRBC: 0 % (ref 0.0–0.2)

## 2022-04-05 NOTE — ED Triage Notes (Signed)
Reports dizziness that started last night. Reports had it a couple weeks ago worse.  Denies weakness slurred speech.

## 2022-04-05 NOTE — ED Provider Triage Note (Signed)
Emergency Medicine Provider Triage Evaluation Note  Mike Foster , a 56 y.o. male  was evaluated in triage.  Pt complains of dizziness x3 weeks.  Feels like the room is spinning.  This happens intermittently, worse when going sitting to standing.  Denies any lateralized weakness or numbness.  Says occasionally he feels like he is looking through "bug eyes" but otherwise no vision changes happens intermittently.  Denies any weakness, chest pain, shortness of breath.  No neck pain..  Review of Systems  Per HPI  Physical Exam  BP (!) 175/108 (BP Location: Right Arm)   Pulse 78   Temp 98.7 F (37.1 C)   Resp 18   SpO2 97%  Gen:   Awake, no distress   Resp:  Normal effort  MSK:   Moves extremities without difficulty  Other:  Cranial nerves II through XII grossly intact.  No dysarthria, normal finger-nose.  Slightly decreased pulling on the right upper extremity secondary to shoulder replacement.  Grip strength is symmetric, no pronator drift.  Lower extremity strength symmetric, sensation to light touch is grossly intact.  Medical Decision Making  Medically screening exam initiated at 12:22 PM.  Appropriate orders placed.  Claude Waldman was informed that the remainder of the evaluation will be completed by another provider, this initial triage assessment does not replace that evaluation, and the importance of remaining in the ED until their evaluation is complete.     Sherrill Raring, PA-C 04/05/22 1223

## 2022-04-06 ENCOUNTER — Telehealth: Payer: Self-pay

## 2022-04-06 ENCOUNTER — Telehealth: Payer: Self-pay | Admitting: Family Medicine

## 2022-04-06 NOTE — Telephone Encounter (Signed)
Pt calling; pt was at [facility] ED from 04/05/22 until pt left without seeing provider on 04/06/22. Pt had CT of head,labs, and EKG. Pt said on 04/04/22 episodes of dizziness and pt fell in yard on 04/04/22 due to dizziness. Pt did not lose consciousness today no dizziness but pt has H/A with pain level of 6. Pt has no way to ck BP at home. Pt BP on 04/05/22 at ED 175/108 P 78 and 04/06/22 160/108 P 66.  Pt said did not take amlodipine 2.5 mg or lisinopril 20 mg while at ED. Pt took meds this morning when he got home. Pt does not want to go back to ED. No available appts at [facility] today. Pt scheduled appt with [name] NP on 04/07/22 at 10:20 with UC & ED precautions and pt voiced understanding. Pt plans on taking it easy today. Sending note to [name] NP and [place] pool.

## 2022-04-06 NOTE — Telephone Encounter (Signed)
Noted. Thanks.

## 2022-04-06 NOTE — Telephone Encounter (Signed)
See other TE note from this morning. Mike Foster has already spoken with patient today and scheduled appt with [name], NP for tomorrow.

## 2022-04-06 NOTE — ED Notes (Signed)
Patient states the wait is long and he is leaving 

## 2022-04-06 NOTE — Telephone Encounter (Signed)
Please get update on patient.  Thanks. 

## 2022-04-06 NOTE — Telephone Encounter (Signed)
Will evaluate in office

## 2022-04-06 NOTE — Telephone Encounter (Signed)
Rena spoke with patient this am and patient is scheduled with [facility] tomorrow. Dr. Para March asked for update on patient in another TE note.

## 2022-04-07 ENCOUNTER — Ambulatory Visit (INDEPENDENT_AMBULATORY_CARE_PROVIDER_SITE_OTHER): Payer: 59 | Admitting: Nurse Practitioner

## 2022-04-07 ENCOUNTER — Encounter: Payer: Self-pay | Admitting: Nurse Practitioner

## 2022-04-07 VITALS — BP 144/90 | HR 78 | Temp 96.6°F | Resp 14 | Ht 77.0 in | Wt 311.2 lb

## 2022-04-07 DIAGNOSIS — R42 Dizziness and giddiness: Secondary | ICD-10-CM

## 2022-04-07 DIAGNOSIS — I10 Essential (primary) hypertension: Secondary | ICD-10-CM | POA: Diagnosis not present

## 2022-04-07 DIAGNOSIS — H6121 Impacted cerumen, right ear: Secondary | ICD-10-CM | POA: Diagnosis not present

## 2022-04-07 MED ORDER — AMLODIPINE BESYLATE 5 MG PO TABS: 5.0000 mg | ORAL_TABLET | Freq: Every day | ORAL | 0 refills | Status: DC

## 2022-04-07 NOTE — Assessment & Plan Note (Signed)
Patient blood pressure is elevated in office.  He is currently on lisinopril 20 mg amlodipine 2.5 mg we will titrate amlodipine to 5 mg.  Follow-up 2 weeks with primary care provider he will let the office if he continues to have lightheadedness or dizziness.

## 2022-04-07 NOTE — Progress Notes (Signed)
Acute Office Visit  Subjective:     Patient ID: Mike Foster, male    DOB: 1965/07/19, 56 y.o.   MRN: [id number]  Chief Complaint  Patient presents with   Dizziness    Had episode of dizziness that caused to him fall down in the yard on 04/04/22. Went to ER 04/05/22-testing was done but patient did not stay for the whole visit due to the length of wait time. Has been having off and on short spells of feeling dizzy since then but no particular pattern     Patient is in today for Dizziness  States that he was working on the truck putting brakes on. States he was finishing up and it hit him all of a sudden. States that he grabbed the fender of the truck and the environment and states that he felt sick and then lowered himself down to the ground. States that he laid there for approx 15 mins. Then finished the truck and went inside and showered. He states that he felt light headed thereafter.  States that when look up and to the sides he gets a light headed sensation at times.  States that he does not check blood pressure at home.  Currently maintained on lisinopril and amlodipine.  Patient states he is now amlodipine for couple of days  States that it has happened before in oct when he was getting out of bed and fell back in the bed.  But never was evaluated.  Patient did go to the emergency department on 04/05/2022 and was never evaluated by a provider because the wait was too long.  They did obtain basic blood work, EKG and CT scan of the head.  I did review this with patient office  Review of Systems  Constitutional:  Negative for chills and fever.  Respiratory:  Negative for shortness of breath.   Cardiovascular:  Negative for chest pain.  Neurological:  Positive for dizziness. Negative for tingling, focal weakness, weakness and headaches.        Objective:    BP (!) 144/90   Pulse 78   Temp (!) 96.6 F (35.9 C) (Temporal)   Resp 14   Ht 6\' 5"  (1.956 m)   Wt (!) 311 lb 4 oz  (141.2 kg)   SpO2 98%   BMI 36.91 kg/m  BP Readings from Last 3 Encounters:  04/07/22 (!) 144/90  04/06/22 (!) 160/108  11/07/21 (!) 160/105   Wt Readings from Last 3 Encounters:  04/07/22 (!) 311 lb 4 oz (141.2 kg)  04/05/22 (!) 307 lb (139.3 kg)  11/07/21 (!) 307 lb (139.3 kg)      Physical Exam Constitutional:      Appearance: Normal appearance. He is obese.  HENT:     Right Ear: Ear canal and external ear normal. There is impacted cerumen.     Left Ear: Tympanic membrane, ear canal and external ear normal. There is no impacted cerumen.     Mouth/Throat:     Mouth: Mucous membranes are moist.     Pharynx: Oropharynx is clear.  Eyes:     Extraocular Movements: Extraocular movements intact.     Pupils: Pupils are equal, round, and reactive to light.  Neck:     Vascular: No carotid bruit.  Cardiovascular:     Rate and Rhythm: Normal rate and regular rhythm.     Heart sounds: Normal heart sounds.  Pulmonary:     Effort: Pulmonary effort is normal.     Breath  sounds: Normal breath sounds.  Lymphadenopathy:     Cervical: No cervical adenopathy.  Neurological:     General: No focal deficit present.     Mental Status: He is alert.     Cranial Nerves: Cranial nerves 2-12 are intact.     Sensory: Sensory deficit present.     Motor: Motor function is intact.     Coordination: Coordination is intact.     Gait: Gait is intact.     Comments: Bilateral upper and lower extremity strength 5/5  Right 2/3 and 3/3 sensation difference on face       No results found for any visits on 04/07/22.      Assessment & Plan:   Problem List Items Addressed This Visit       Cardiovascular and Mediastinum   HYPERTENSION, CONTROLLED - Primary    Patient blood pressure is elevated in office.  He is currently on lisinopril 20 mg amlodipine 2.5 mg we will titrate amlodipine to 5 mg.  Follow-up 2 weeks with primary care provider he will let the office if he continues to have  lightheadedness or dizziness.      Relevant Medications   amLODipine (NORVASC) 5 MG tablet     Nervous and Auditory   Impacted cerumen of right ear    Verbal consent obtained.  Patient was prepped per office policy.  A mixture of water hydroperoxide was used and ear was irrigated.  Patient tolerated procedure well.  Impaction was removed.      Relevant Orders   Ear Lavage     Other   Lightheadedness    Ambiguous in nature.  Neurological exam benign in office.  Patient does have a sensation disturbance to the 2/3 and 3/3 part of the right face facial nerve pattern.  CT scan of head and emergency department was negative.  Do think this could be related to patient's blood pressure being elevated.  We will titrate his amlodipine from 2.5 to 5 mg.  Continue lisinopril 20 mg.  If symptoms change or worsen he will reach out we can always pursue an MRI at that point or sooner the emergency department.  Patient acknowledged.  He will follow-up with primary care in 2 weeks       Meds ordered this encounter  Medications   DISCONTD: amLODipine (NORVASC) 5 MG tablet    Sig: Take 1 tablet (5 mg total) by mouth daily.    Dispense:  90 tablet    Refill:  0    Order Specific Question:   Supervising Provider    Answer:   Loura Pardon A [[id number]]   amLODipine (NORVASC) 5 MG tablet    Sig: Take 1 tablet (5 mg total) by mouth daily.    Dispense:  90 tablet    Refill:  0    Return in about 2 weeks (around 04/21/2022) for BP/Lightheadedness follow up with Dr. Damita Dunnings .  Romilda Garret, NP

## 2022-04-07 NOTE — Patient Instructions (Signed)
Nice to see you today I am changing the dose of the amlodipine to 5mg  daily. You can take 2 of the 2.5 mg tablets until you finish what you got. I want you to see Dr. in 2 weeks for a recheck

## 2022-04-07 NOTE — Assessment & Plan Note (Signed)
Ambiguous in nature.  Neurological exam benign in office.  Patient does have a sensation disturbance to the 2/3 and 3/3 part of the right face facial nerve pattern.  CT scan of head and emergency department was negative.  Do think this could be related to patient's blood pressure being elevated.  We will titrate his amlodipine from 2.5 to 5 mg.  Continue lisinopril 20 mg.  If symptoms change or worsen he will reach out we can always pursue an MRI at that point or sooner the emergency department.  Patient acknowledged.  He will follow-up with primary care in 2 weeks

## 2022-04-07 NOTE — Assessment & Plan Note (Signed)
Verbal consent obtained.  Patient was prepped per office policy.  A mixture of water hydroperoxide was used and ear was irrigated.  Patient tolerated procedure well.  Impaction was removed.

## 2022-04-25 ENCOUNTER — Ambulatory Visit (INDEPENDENT_AMBULATORY_CARE_PROVIDER_SITE_OTHER): Payer: 59 | Admitting: Family Medicine

## 2022-04-25 ENCOUNTER — Encounter: Payer: Self-pay | Admitting: Family Medicine

## 2022-04-25 VITALS — BP 122/82 | HR 58 | Temp 97.8°F | Ht 77.0 in | Wt 310.0 lb

## 2022-04-25 DIAGNOSIS — R42 Dizziness and giddiness: Secondary | ICD-10-CM

## 2022-04-25 DIAGNOSIS — I10 Essential (primary) hypertension: Secondary | ICD-10-CM

## 2022-04-25 MED ORDER — AMLODIPINE BESYLATE 5 MG PO TABS: 5.0000 mg | ORAL_TABLET | Freq: Every day | ORAL | 1 refills | Status: DC

## 2022-04-25 NOTE — Progress Notes (Unsigned)
Follow up.  Now on amlodipine 5mg  a day.    Prev episode when getting out of bed.  The room was spinning at the time, clearly worse when getting out of bed.  No syncope.  Had room spinning at the time.    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  Quick onset with position change. Room spinning at the time.  No syncope.  Had to lay down.    No other events like that in the meantime except for mild sx "like I feel it coming but they go away real quick."    Prev R sided facial sx resolved.  No sx in the meantime.    Had CT and labs done at ER.   He had prev pop in L shoulder with ROM.  D/w pt about options and he didn't want to see ortho at this point, d/w pt.  Pain improved in the meantime.  Normal ROM today.    Meds, vitals, and allergies reviewed.   ROS: Per HPI unless specifically indicated in ROS section   GEN: nad, alert and oriented HEENT: EOMI, NCAT, TM wnl B NECK: supple w/o LA CV: rrr.  PULM: ctab, no inc wob ABD: soft, +bs EXT: no edema SKIN: no acute rash Normal L shoulder ROM.   CN 2-12 wnl B, S/S wnl x4, normal facial sensation B.    He has mild vertigo sx with looking upward but not with other position change.

## 2022-04-25 NOTE — Patient Instructions (Signed)
No change in meds.   Update me as needed.  Use the bedside exercise for vertigo.   Likely BPV.

## 2022-04-27 DIAGNOSIS — R42 Dizziness and giddiness: Secondary | ICD-10-CM | POA: Insufficient documentation

## 2022-04-27 NOTE — Assessment & Plan Note (Signed)
Controlled.  Continue amlodipine and lisinopril. 

## 2022-04-27 NOTE — Assessment & Plan Note (Signed)
See after visit summary.  Use the bedside exercise for vertigo.   Likely BPV.  Update me as needed.  Anatomy and pathophysiology discussed with patient.

## 2022-06-02 ENCOUNTER — Other Ambulatory Visit: Payer: Self-pay | Admitting: Nurse Practitioner

## 2022-06-02 DIAGNOSIS — I10 Essential (primary) hypertension: Secondary | ICD-10-CM

## 2022-07-01 ENCOUNTER — Telehealth: Payer: Self-pay | Admitting: Family Medicine

## 2022-07-01 NOTE — Telephone Encounter (Signed)
Called patient reviewed all information and repeated back to me. Will call if any questions. Will go to urgent care for evaluation today after work.

## 2022-07-01 NOTE — Telephone Encounter (Signed)
I spoke with pt;pt said someone at work took his BP on 06/30/22 and BP was 175/105 and today at 11 AM BP 177/111 P?. Pt said he takes Lisinopril 20 mg and amlodipine 5 mg every morning and pt has not missed taking med. Pt said No H/A,.dizziness, CP or SOB. Pt cannot reck BP now because he is eating at a restaurant. Pt said last night he stopped at [employer] and BP was high; pt could not remember what BP reading was. No available appts at [facility] or [facility] and I Offered to schedule appt at [facility] today at 3:15. Pt said no but took address and phone # for [facility] and he will call them later. Pt did not want to schedule the 3:15 appt so I advised UC in [place] is open until 8 PM. UC & ED precautions given and pt voiced understanding. Sending note to Dr Damita Dunnings and Damita Dunnings pool and will teams Jessica cma.

## 2022-07-01 NOTE — Telephone Encounter (Signed)
[facility] - Client TELEPHONE ADVICE RECORD AccessNurse Patient Name: Mike Foster AN Gender: Male DOB: [birth year] Age: 57 Y 18 D Return Phone Number: [phone] (Primary) Address: City/ State/ Zip: [place]  [address] Client [place] [facility] - Client Client Site [place] - Day Provider Renford Dills - MD Contact Type Call Who Is Calling Patient / Member / Family / Caregiver Call Type Triage / Clinical Relationship To Patient Self Return Phone Number [phone] (Primary) Chief Complaint Blood Pressure High Reason for Call Symptomatic / Request for [place] states his blood pressure is 177/111. Translation No Nurse Assessment Nurse: Mariel Sleet, RN, Erasmo Downer Date/Time (Eastern Time): 07/01/2022 11:51:20 AM Confirm and document reason for call. If symptomatic, describe symptoms. ---Caller states that he had a blood pressure reading of 177/111. Does the patient have any new or worsening symptoms? ---Yes Will a triage be completed? ---Yes Related visit to physician within the last 2 weeks? ---No Does the PT have any chronic conditions? (i.e. diabetes, asthma, this includes High risk factors for pregnancy, etc.) ---Yes List chronic conditions. ---HTN Is this a behavioral health or substance abuse call? ---No Guidelines Guideline Title Affirmed Question Affirmed Notes Nurse Date/Time (Eastern Time) Blood Pressure - High Systolic BP >= 518 OR Diastolic >= 841 Donadeo, RN, Kristin 07/01/2022 11:52:02 AM Disp. Time Eilene Ghazi Time) Disposition Final User 07/01/2022 11:53:55 AM See PCP within 24 Hours Yes Mariel Sleet, RN, Erasmo Downer Final Disposition 07/01/2022 11:53:55 AM See PCP within 24 Hours Yes Donadeo, RN, Erasmo Downer PLEASE NOTE: All timestamps contained within this report are represented as [place] Standard Time. CONFIDENTIALTY NOTICE: This fax transmission is intended  only for the addressee. It contains information that is legally privileged, confidential or otherwise protected from use or disclosure. If you are not the intended recipient, you are strictly prohibited from reviewing, disclosing, copying using or disseminating any of this information or taking any action in reliance on or regarding this information. If you have received this fax in error, please notify us immediately by telephone so that we can arrange for its return to [place]. Phone: [phone], Toll-Free: [phone], Fax: [phone] Page: 2 of 2 Call Id: [id number] [place] Disagree/Comply Comply Caller Understands Yes PreDisposition Call Doctor Care Advice Given Per Guideline SEE PCP WITHIN 24 HOURS: * IF OFFICE WILL BE OPEN: You need to be examined within the next 24 hours. Call your doctor (or NP/PA) when the office opens and make an appointment. CALL BACK IF: HIGH BLOOD PRESSURE: * Weakness or numbness of the face, arm or leg on one side of the body occurs * Difficulty walking, difficulty talking, or severe headache occurs * Chest pain or difficulty breathing occurs * You become worse CARE ADVICE given per High Blood Pressure (Adult) guideline. Referrals REFERRED TO PCP OFFIC

## 2022-07-01 NOTE — Telephone Encounter (Signed)
Patient called and stated his blood pressure /yesterday was 180/106 and today reading was 177/111. Patient was sent to access nurse.

## 2022-07-01 NOTE — Telephone Encounter (Signed)
Agree with getting eval done.  He could take extra dose of 5mg  amlodipine if needed if BP is still elevated.  Thanks.

## 2022-07-02 ENCOUNTER — Encounter: Payer: Self-pay | Admitting: Emergency Medicine

## 2022-07-02 ENCOUNTER — Ambulatory Visit
Admission: EM | Admit: 2022-07-02 | Discharge: 2022-07-02 | Disposition: A | Discharge status: Home / Self Care | Payer: 59 | Attending: Emergency Medicine | Admitting: Emergency Medicine

## 2022-07-02 DIAGNOSIS — I10 Essential (primary) hypertension: Secondary | ICD-10-CM

## 2022-07-02 NOTE — Discharge Instructions (Addendum)
In the urgent care your blood pressure was 163/92, while elevated this is a nonemergent level and you currently have no signs that your blood pressure is affecting the other areas of your body  EKG shows that your heart is beating in a normal pace and rhythm  Please take the extra dose of amlodipine as instructed by your primary doctor  Please notify your primary doctor of today's visit and attempt to get follow-up appointment within the week  At any point if you begin to experience chest pain, shortness of breath, dizziness, lightheadedness, memory or speech changes or weakness and your blood pressure is elevated please go to the nearest emergency department for immediate evaluation

## 2022-07-02 NOTE — ED Provider Notes (Addendum)
[facility] Foster    CSN: [id number] Arrival date & time: 07/02/22  8315      History   Chief Complaint Chief Complaint  Patient presents with   Hypertension    HPI Mike Foster is a 57 y.o. male.   Patient presents for evaluation of pressure check.  Pressure was taken at work 1 day prior, readings 175/105 and 177/111, denies symptoms at that time.  Denies current symptoms.  Taking daily lisinopril 20 mg and amlodipine 5 mg every morning without missing doses.  Denies changes in daily activity, denies increase in stress level, denies dietary changes and endorses a low-salt diet.  Patient notified his PCP and he was advised in urgent care visit.   Past Medical History:  Diagnosis Date   HTN (hypertension)    Psoriatic arthritis ([place])    on enbrel and mtx   Seizure ([place])    in childhood; none since 5th grade    Patient Active Problem List   Diagnosis Date Noted   Vertigo 04/27/2022   Gout 10/31/2021   Other headache syndrome 04/07/2021   Wart 02/28/2021   Sciatica 10/04/2020   Right knee pain 12/27/2017   Sleep disorder 12/27/2017   Groin pain 03/17/2015   Routine general medical examination at a health care facility 10/20/2014   Advance care planning 10/20/2014   Pain in joint, shoulder region 01/09/2014   HYPERTENSION, CONTROLLED 12/29/2009   PSORIATIC ARTHRITIS 05/14/2007    Past Surgical History:  Procedure Laterality Date   CERVICAL SPINE SURGERY     Shoulder surgery Left 06/11/2018       Home Medications    Prior to Admission medications   Medication Sig Start Date End Date Taking? Authorizing Provider  allopurinol (ZYLOPRIM) 100 MG tablet Take 100 mg by mouth daily. 08/14/21  Yes [provider]  amLODipine (NORVASC) 5 MG tablet TAKE 1 TABLET(5 MG) BY MOUTH DAILY 06/03/22  Yes Tonia Ghent, MD  folic acid (FOLVITE) 1 MG tablet Take 1 mg by mouth daily.   Yes [provider]  lisinopril (ZESTRIL) 20 MG tablet TAKE 1 TABLET(20 MG)  BY MOUTH DAILY 10/26/21  Yes Tonia Ghent, MD  methotrexate (RHEUMATREX) 2.5 MG tablet Take 6 tablets (15 mg total) by mouth once a week. Caution:Chemotherapy. Protect from light. 10/02/20  Yes Tonia Ghent, MD  golimumab (Amelia ARIA) 50 MG/4ML SOLN injection Through an iv    [provider]    Family History Family History  Problem Relation Age of Onset   COPD Mother    Heart disease Mother    COPD Father    Lung cancer Father    Pancreatic cancer Maternal Aunt    Colon cancer Neg Hx    Prostate cancer Neg Hx     Social History Social History   Tobacco Use   Smoking status: Never   Smokeless tobacco: Former  Scientific laboratory technician Use: Never used  Substance Use Topics   Alcohol use: Yes    Alcohol/week: 0.0 standard drinks of alcohol    Comment: occasional   Drug use: No     Allergies   Gabapentin   Review of Systems Review of Systems  Constitutional: Negative.   HENT: Negative.    Respiratory: Negative.    Cardiovascular: Negative.   Gastrointestinal: Negative.   Neurological: Negative.      Physical Exam Triage Vital Signs ED Triage Vitals  Enc Vitals Group     BP 07/02/22 0914 (!) 163/92  Pulse Rate 07/02/22 0914 86     Resp 07/02/22 0914 18     Temp 07/02/22 0914 98.9 F (37.2 C)     Temp Source 07/02/22 0914 Oral     SpO2 07/02/22 0914 96 %     Weight --      Height --      Head Circumference --      Peak Flow --      Pain Score 07/02/22 0915 0     Pain Loc --      Pain Edu? --      Excl. in [place]? --    No data found.  Updated Vital Signs BP (!) 163/92 (BP Location: Left Arm)   Pulse 86   Temp 98.9 F (37.2 C) (Oral)   Resp 18   SpO2 96%   Visual Acuity Right Eye Distance:   Left Eye Distance:   Bilateral Distance:    Right Eye Near:   Left Eye Near:    Bilateral Near:     Physical Exam Constitutional:      Appearance: Normal appearance.  Eyes:     Extraocular Movements: Extraocular movements intact.   Cardiovascular:     Rate and Rhythm: Normal rate and regular rhythm.     Pulses: Normal pulses.     Heart sounds: Normal heart sounds.  Pulmonary:     Effort: Pulmonary effort is normal.     Breath sounds: Normal breath sounds.  Neurological:     General: No focal deficit present.     Mental Status: He is alert and oriented to person, place, and time. Mental status is at baseline.      UC Treatments / Results  Labs (all labs ordered are listed, but only abnormal results are displayed) Labs Reviewed - No data to display  EKG   Radiology No results found.  Procedures Procedures (including critical care time)  Medications Ordered in UC Medications - No data to display  Initial Impression / Assessment and Plan / UC Course  I have reviewed the triage vital signs and the nursing notes.  Pertinent labs & imaging results that were available during my care of the patient were reviewed by me and considered in my medical decision making (see chart for details).  Elevated blood pressure Reading in office with diagnosis of hypertension  Blood pressure in triage 163/92, no current signs of hypertensive urgency, S1 and S2 heard to auscultation, EKG showing normal sinus rhythm, discussed all findings with patient, stable at this time, recommended continue to monitor at home and is advised to continue to take daily medications as directed, may take extra dose of amlodipine as recommended by his primary doctor, patient to notify primary doctor of visit today Monday morning and attempt to get follow-up within the week for further management, given strict precautions at any point if his blood pressure is adequate elevated and that he has signs of hypertensive urgency he is to go to the nearest emergency department for immediate evaluation and workup, verbalized understanding Final Clinical Impressions(s) / UC Diagnoses   Final diagnoses:  Elevated blood pressure reading in office with  diagnosis of hypertension     Discharge Instructions      In the urgent care your blood pressure was 163/92, while elevated this is a nonemergent level and you currently have no signs that your blood pressure is affecting the other areas of your body  EKG shows that your heart is beating in a normal pace and  rhythm  Please take the extra dose of amlodipine as instructed by your primary doctor  Please notify your primary doctor of today's visit and attempt to get follow-up appointment within the week  At any point if you begin to experience chest pain, shortness of breath, dizziness, lightheadedness, memory or speech changes or weakness and your blood pressure is elevated please go to the nearest emergency department for immediate evaluation   ED Prescriptions   None    PDMP not reviewed this encounter.   Hans Eden, NP 07/02/22 0945    Hans Eden, NP 07/02/22 [phone]

## 2022-07-02 NOTE — ED Triage Notes (Signed)
Pt took his BP yesterday and it was elevated 170's/110's pt called his PCP and advised him to be seen at the urgent care. Pt denies any symptoms at the moment.

## 2022-07-03 NOTE — Telephone Encounter (Signed)
Please get update on patient on Monday.  Thanks. 

## 2022-07-04 NOTE — Telephone Encounter (Signed)
Unable to reach patient. Left voicemail to return call to our office.   

## 2022-07-04 NOTE — Telephone Encounter (Signed)
Went to UC on Saturday and his BP was elevated at 163/92 . They did an EKG which was normal per patient report. Scheduled appt 07/08/22 to follow up in office. Advised to keep a log of his BP readings and bring that with him.

## 2022-07-04 NOTE — Telephone Encounter (Signed)
Noted. Thanks.

## 2022-07-08 ENCOUNTER — Encounter: Payer: Self-pay | Admitting: Family Medicine

## 2022-07-08 ENCOUNTER — Ambulatory Visit: Payer: 59 | Admitting: Family Medicine

## 2022-07-08 ENCOUNTER — Ambulatory Visit (INDEPENDENT_AMBULATORY_CARE_PROVIDER_SITE_OTHER): Payer: 59 | Admitting: Family Medicine

## 2022-07-08 VITALS — BP 164/70 | HR 80 | Temp 97.9°F | Ht 77.0 in | Wt 316.0 lb

## 2022-07-08 DIAGNOSIS — I10 Essential (primary) hypertension: Secondary | ICD-10-CM | POA: Diagnosis not present

## 2022-07-08 LAB — BASIC METABOLIC PANEL
BUN: 16 mg/dL (ref 6–23)
CO2: 25 mEq/L (ref 19–32)
Calcium: 9.3 mg/dL (ref 8.4–10.5)
Chloride: 106 mEq/L (ref 96–112)
Creatinine, Ser: 1.27 mg/dL (ref 0.40–1.50)
GFR: 62.47 mL/min (ref >60.00)
Glucose, Bld: 102 mg/dL — ABNORMAL HIGH (ref 70–99)
Potassium: 4.1 mEq/L (ref 3.5–5.1)
Sodium: 140 mEq/L (ref 135–145)

## 2022-07-08 MED ORDER — AMLODIPINE BESYLATE 5 MG PO TABS: ORAL_TABLET | ORAL | Status: DC

## 2022-07-08 NOTE — Patient Instructions (Addendum)
Go to the lab on the way out.   If you have mychart we'll likely use that to update you.    Increase to 7.26m amlodipine for now.  If needed, then increase to 123ma day if BP is still above 140/90.    Keep taking lisinopril 2053mor now.  Take care.  Glad to see you.

## 2022-07-08 NOTE — Progress Notes (Unsigned)
Hypertension:    Using medication without problems or lightheadedness: yes Chest pain with exertion:no Edema:occ BLE edema at the end of the day.   Short of breath:no Checked BP mult times at work and BP was elevated.  Up to 180s SBP.   Has been taking 28m lisinopril and 535mamlodipine.  Labs pending.    BP has been up on multiple cuffs.    He cut out pork, has been paying attention to diet and limiting salt.    He had BP elevations at infusions, ie higher after infusion than at check in.    L sciatica going on for 1 year episodically.  Dw pt about options.  Discussed with him about following up with neurosurgery.  Meds, vitals, and allergies reviewed.   ROS: Per HPI unless specifically indicated in ROS section   GEN: nad, alert and oriented HEENT: mucous membranes moist NECK: supple w/o LA CV: rrr. PULM: ctab, no inc wob ABD: soft, +bs EXT: trace mid calf edema (with high top boots on) SKIN: no acute rash

## 2022-07-10 NOTE — Assessment & Plan Note (Signed)
Has been taking 59m lisinopril and 513mamlodipine.  Labs pending.   Increase to 7.70m58mmlodipine for now.  If needed, then increase to 35m27mday if BP is still above 140/90.    Keep taking lisinopril 20mg12m now.

## 2022-07-19 ENCOUNTER — Telehealth: Payer: Self-pay | Admitting: Family Medicine

## 2022-07-19 MED ORDER — LISINOPRIL 20 MG PO TABS
ORAL_TABLET | ORAL | Status: DC
Start: 2022-07-19 — End: 2022-11-01

## 2022-07-19 NOTE — Telephone Encounter (Signed)
Spoke with patient and he is taking 10 mg amlodipine and 32m lisinopril. Advised patient of medication increase and advised to call back in 10 days to let [place] about [place] BP. Patient verbalized understanding.

## 2022-07-19 NOTE — Telephone Encounter (Signed)
Pt called to let Lollie know his BP readings: 2/16 :168/111 2-17 : 168/111 2/18 : 186/126 2/19 : 148/100 Pt stated he usually records his BP readings In the evening due to his work schedule so he hasn't gotten today's reading. Call back # [phone]

## 2022-07-19 NOTE — Addendum Note (Signed)
Addended by: Tonia Ghent on: 07/19/2022 03:05 PM   Modules accepted: Orders

## 2022-07-19 NOTE — Telephone Encounter (Signed)
Please verify with patient. If currently taking 44m amlodipine and 240mlisinopril, then continue 1043mmlodipine and inc to 65m59msinopril (1.5 tabs).  And update us [place] BP in about 10 days.  If currently taking 7.5mg 53modipine and 20mg 69mnopril, would inc to 10mg a9mipine.   And update us abou[place]BP in about 10 days.  Thanks.

## 2022-10-11 ENCOUNTER — Encounter: Payer: Self-pay | Admitting: Family Medicine

## 2022-10-11 ENCOUNTER — Ambulatory Visit: Payer: 59 | Admitting: Family Medicine

## 2022-10-11 DIAGNOSIS — I10 Essential (primary) hypertension: Secondary | ICD-10-CM

## 2022-10-11 LAB — BASIC METABOLIC PANEL
BUN: 15 mg/dL (ref 6–23)
CO2: 27 mEq/L (ref 19–32)
Calcium: 9.5 mg/dL (ref 8.4–10.5)
Chloride: 106 mEq/L (ref 96–112)
Creatinine, Ser: 1.11 mg/dL (ref 0.40–1.50)
GFR: 73.29 mL/min (ref >60.00)
Glucose, Bld: 91 mg/dL (ref 70–99)
Potassium: 4.2 mEq/L (ref 3.5–5.1)
Sodium: 141 mEq/L (ref 135–145)

## 2022-10-11 MED ORDER — AMLODIPINE BESYLATE 10 MG PO TABS: 10.0000 mg | ORAL_TABLET | Freq: Every day | ORAL | 3 refills | Status: DC

## 2022-10-11 NOTE — Patient Instructions (Addendum)
Increase to 10mg  amlodipine and update me if your BP is still above 140/90 after 1 week.   Take care.  Glad to see you. Go to the lab on the way out.   If you have mychart we'll likely use that to update you.

## 2022-10-11 NOTE — Progress Notes (Unsigned)
He has been taking 5mg  amlodipine daily and 30mg  lisinopril daily.   Glucose 128, nonfasting.   Cr 1.41 CO2 15.    BP elevated today and on home checks.  No exertional CP.  "I can walk all day".  No BLE edema.    He has reproducible positional tenderness on the R chest wall, with certain positions sitting.

## 2022-10-12 NOTE — Assessment & Plan Note (Signed)
Increase to 10mg  amlodipine and update me if BP is still above 140/90 after 1 week.  Continue lisinopril as is.  See notes on labs.

## 2022-10-31 ENCOUNTER — Telehealth: Payer: Self-pay | Admitting: Family Medicine

## 2022-10-31 NOTE — Telephone Encounter (Signed)
FYI: This call has been transferred to Access Nurse. Once the result note has been entered staff can address the message at that time.  Patient called in with the following symptoms:  Red Word: Swollen ankles since Saturday on amlodipine effective May 14th , patient believes this is the reason   Please advise at [facility] [phone]  Message is routed to Provider Pool and [facility] Triage

## 2022-10-31 NOTE — Telephone Encounter (Signed)
Per appt notes pt already has appt scheduled with Dr Milinda Antis on 11/01/22 at 3:30. Sending note to Dr Rubbie Battiest pool and will teams [facility] CMA.

## 2022-11-01 ENCOUNTER — Ambulatory Visit: Payer: 59 | Admitting: Family Medicine

## 2022-11-01 MED ORDER — AMLODIPINE BESYLATE 10 MG PO TABS: 5.0000 mg | ORAL_TABLET | Freq: Every day | ORAL | Status: DC

## 2022-11-01 MED ORDER — LISINOPRIL 20 MG PO TABS
ORAL_TABLET | ORAL | Status: DC
Start: 2022-11-01 — End: 2022-11-09

## 2022-11-01 NOTE — Telephone Encounter (Signed)
I spoke with pt; pt said had mandatory meeting at work today and already cancelled appt with Dr Milinda Antis. Pt said swelling in feet worsen ed after starting amlodipine. Pt said swelling goes down overnight. Pt said as day goes own swelling in feet returns. No CP or SOB. No leg pain. Pt rescheduled but only wants to see Dr Para March.pt scheduled appt with Dr Para March 11/07/22 at 3pm with UC & ED precautions. Pt voiced understanding.sending note to Dr Para March and Para March pool.

## 2022-11-01 NOTE — Telephone Encounter (Signed)
Pt called to cancel appt with [facility] for today, 6/4. Pt stated he doesn't have time to attend the appt, he has a work meeting around the same time of ov, that he cannot miss. Call back # [phone]

## 2022-11-01 NOTE — Telephone Encounter (Signed)
Please call pt.  I would try dropping amlodipine to 5mg  a day and increase lisinopril to 40mg  a day in the meantime.  We can recheck BP at  the OV and he can see if the swelling is any better in the meantime.  Thanks.

## 2022-11-01 NOTE — Addendum Note (Signed)
Addended by: Joaquim Nam on: 11/01/2022 02:08 PM   Modules accepted: Orders

## 2022-11-01 NOTE — Telephone Encounter (Signed)
Called patient and advised medication changes. Patient verbalized understanding.

## 2022-11-06 ENCOUNTER — Other Ambulatory Visit: Payer: Self-pay | Admitting: Family Medicine

## 2022-11-07 ENCOUNTER — Ambulatory Visit: Payer: 59 | Admitting: Family Medicine

## 2022-11-07 ENCOUNTER — Encounter: Payer: Self-pay | Admitting: Family Medicine

## 2022-11-07 VITALS — BP 140/80 | HR 75 | Temp 98.7°F | Ht 77.0 in | Wt 314.0 lb

## 2022-11-07 DIAGNOSIS — I10 Essential (primary) hypertension: Secondary | ICD-10-CM | POA: Diagnosis not present

## 2022-11-07 NOTE — Patient Instructions (Signed)
Go to the lab on the way out.   If you have mychart we'll likely use that to update you.    Keep taking lisinopril 40mg  a day for now.  We'll be in touch.   Take care.  Glad to see you.

## 2022-11-07 NOTE — Progress Notes (Signed)
Prev with sig BLE edema.  Was on amlodipine, has been off that med for about 1 week.    He has pictures with BLE edema noted, taken on 10/29/22, while on amlodipine.    Has been on 40mg  lisinopril for 1 week.  No CP.     Separate issue with poison oak exposure last week.      Check BMET and rx lasix later if needed.  He'll need rx for 40mg  lisinopril if bmet okay.       Trace BLE edema.  Poison oak rash on the BLE.    [facility] [address]

## 2022-11-08 LAB — BASIC METABOLIC PANEL
BUN: 22 mg/dL (ref 6–23)
CO2: 23 mEq/L (ref 19–32)
Calcium: 9.3 mg/dL (ref 8.4–10.5)
Chloride: 106 mEq/L (ref 96–112)
Creatinine, Ser: 1.28 mg/dL (ref 0.40–1.50)
GFR: 61.74 mL/min (ref >60.00)
Glucose, Bld: 111 mg/dL — ABNORMAL HIGH (ref 70–99)
Potassium: 4.3 mEq/L (ref 3.5–5.1)
Sodium: 137 mEq/L (ref 135–145)

## 2022-11-09 ENCOUNTER — Other Ambulatory Visit: Payer: Self-pay | Admitting: Family Medicine

## 2022-11-09 MED ORDER — FUROSEMIDE 20 MG PO TABS: 20.0000 mg | ORAL_TABLET | Freq: Every day | ORAL | 1 refills | Status: DC | PRN

## 2022-11-09 MED ORDER — LISINOPRIL 40 MG PO TABS
40.0000 mg | ORAL_TABLET | Freq: Every day | ORAL | 3 refills | Status: DC
Start: 2022-11-09 — End: 2023-11-06

## 2022-11-09 NOTE — Assessment & Plan Note (Signed)
Pharmacy [facility] on [address].   Edema improved off amlodipine but not completely resolved. Check BMET and consider rx lasix later if needed.  He'll need rx for 40mg  lisinopril if bmet okay.

## 2023-02-20 ENCOUNTER — Telehealth: Payer: Self-pay | Admitting: Family Medicine

## 2023-02-20 NOTE — Telephone Encounter (Signed)
FYI: This call has been transferred to Access Nurse. Once the result note has been entered staff can address the message at that time.  Patient called in with the following symptoms:  Red Word: sob   Please advise at Mobile [phone] (mobile)  Message is routed to Provider Pool and [facility] Triage    Pt called in stating he has been experiencing sob & has some BP concerns. Pt wasn't able to provide any BP levels due to machine recently breaking. No other symptoms. Scheduled pt with Dr. Para March for thurs, 9/26 @ 12pm, per pt's request. Transferred to access nurse.

## 2023-02-21 NOTE — Telephone Encounter (Signed)
Noted. Thanks.

## 2023-02-21 NOTE — Telephone Encounter (Signed)
I spoke with Mike Foster; offered Mike Foster sooner appt with different provider and Mike Foster said only wants to see Dr Para March. Does have SOB upon exertion.and UC & ED precautions given and Mike Foster voiced understanding. No SOB at this time. Sending note to Dr Para March and Para March pool.

## 2023-02-23 ENCOUNTER — Encounter: Payer: Self-pay | Admitting: Family Medicine

## 2023-02-23 ENCOUNTER — Ambulatory Visit: Payer: 59 | Admitting: Family Medicine

## 2023-02-23 ENCOUNTER — Ambulatory Visit (INDEPENDENT_AMBULATORY_CARE_PROVIDER_SITE_OTHER)
Admission: RE | Admit: 2023-02-23 | Discharge: 2023-02-23 | Disposition: A | Discharge status: Home / Self Care | Payer: 59 | Source: Ambulatory Visit | Attending: Family Medicine | Admitting: Family Medicine

## 2023-02-23 VITALS — BP 148/84 | HR 88 | Temp 98.5°F | Ht 77.0 in | Wt 317.0 lb

## 2023-02-23 DIAGNOSIS — R0602 Shortness of breath: Secondary | ICD-10-CM

## 2023-02-23 DIAGNOSIS — I10 Essential (primary) hypertension: Secondary | ICD-10-CM

## 2023-02-23 LAB — CBC WITH DIFFERENTIAL/PLATELET
Basophils Absolute: 0.1 10*3/uL (ref 0.0–0.1)
Basophils Relative: 1.2 % (ref 0.0–3.0)
Eosinophils Absolute: 0.2 10*3/uL (ref 0.0–0.7)
Eosinophils Relative: 2.1 % (ref 0.0–5.0)
HCT: 43.9 % (ref 39.0–52.0)
Hemoglobin: 14.7 g/dL (ref 13.0–17.0)
Lymphocytes Relative: 23 % (ref 12.0–46.0)
Lymphs Abs: 1.8 10*3/uL (ref 0.7–4.0)
MCHC: 33.5 g/dL (ref 30.0–36.0)
MCV: 100 fl (ref 78.0–100.0)
Monocytes Absolute: 0.7 10*3/uL (ref 0.1–1.0)
Monocytes Relative: 8.5 % (ref 3.0–12.0)
Neutro Abs: 5.2 10*3/uL (ref 1.4–7.7)
Neutrophils Relative %: 65.2 % (ref 43.0–77.0)
Platelets: 190 10*3/uL (ref 150.0–400.0)
RBC: 4.39 Mil/uL (ref 4.22–5.81)
RDW: 14.4 % (ref 11.5–15.5)
WBC: 8 10*3/uL (ref 4.0–10.5)

## 2023-02-23 LAB — COMPREHENSIVE METABOLIC PANEL
ALT: 35 U/L (ref 0–53)
AST: 22 U/L (ref 0–37)
Albumin: 4.4 g/dL (ref 3.5–5.2)
Alkaline Phosphatase: 71 U/L (ref 39–117)
BUN: 22 mg/dL (ref 6–23)
CO2: 24 mEq/L (ref 19–32)
Calcium: 9.4 mg/dL (ref 8.4–10.5)
Chloride: 108 mEq/L (ref 96–112)
Creatinine, Ser: 1.3 mg/dL (ref 0.40–1.50)
GFR: 60.48 mL/min (ref >60.00)
Glucose, Bld: 96 mg/dL (ref 70–99)
Potassium: 4.5 mEq/L (ref 3.5–5.1)
Sodium: 140 mEq/L (ref 135–145)
Total Bilirubin: 0.4 mg/dL (ref 0.2–1.2)
Total Protein: 6.9 g/dL (ref 6.0–8.3)

## 2023-02-23 LAB — TSH: TSH: 1.91 u[IU]/mL (ref 0.35–5.50)

## 2023-02-23 LAB — BRAIN NATRIURETIC PEPTIDE: Pro B Natriuretic peptide (BNP): 56 pg/mL (ref 0.0–100.0)

## 2023-02-23 NOTE — Progress Notes (Signed)
Hypertension:    Taking lasix about 2-3 times per week.  Still on lisinopril 40mg  per day.    He clearly had higher BP after simponi infusion.  SOBOE, noted after simponi start.  Worse in the last few months.  Some occ CP, can radiate from the lower back up to the chest or into the groin.  Can happen at rest or with exertion.  Not clearly with exertion- it isn't consistent with exertion.  Is noted with stooping/squatting/being in an awkward position.   Some occ clear sputum.  More often with dry cough.  Cough noted since increase in lisinopril.    He is seeing Dr. Dierdre Forth with rheumatology.  He is still having flares of joint pain in the meantime.  He felt better taking enbrel with less joint pain and fatigue.    Meds, vitals, and allergies reviewed.   ROS: Per HPI unless specifically indicated in ROS section   GEN: nad, alert and oriented HEENT: ncat NECK: supple w/o LA CV: rrr.  no murmur PULM: ctab, no inc wob ABD: soft, +bs EXT: Trace BLE edema.  SKIN: no acute rash

## 2023-02-23 NOTE — Patient Instructions (Signed)
Don't change your meds yet.   Go to the lab on the way out.   If you have mychart we'll likely use that to update you.    Take care.  Glad to see you.

## 2023-02-26 NOTE — Assessment & Plan Note (Signed)
Multiple issues to consider.  He has had higher blood pressure readings after Simponi infusion.  He also noticed increasing shortness of breath on exertion after Simponi start.  When I asked for input from Dr. Dierdre Forth.  He still having flares of joint pain in the meantime and he felt better when he is taking Enbrel with less joint pain and fatigue.  He has also had a cough that is dry, noted since increasing lisinopril dose.  We may need to change his lisinopril.  I think it makes sense to check his labs first to make sure there is not an obvious abnormality there and then get input from rheumatology.

## 2023-02-27 ENCOUNTER — Telehealth: Payer: Self-pay | Admitting: Family Medicine

## 2023-02-27 NOTE — Telephone Encounter (Signed)
Pt called returning Mike Foster's call regarding results. Told pt Whiten's response, pt had no questions/concerns. Call back # [phone]

## 2023-02-28 NOTE — Telephone Encounter (Signed)
Result note updated.

## 2023-03-21 ENCOUNTER — Telehealth: Payer: Self-pay | Admitting: Family Medicine

## 2023-03-21 NOTE — Telephone Encounter (Signed)
Please see message below and please call Dr. Shawnee Knapp office.  I haven't seen a response yet and I need his input.   Thanks.  ================================     Please call Dr. Shawnee Knapp office.  Pt felt better taking enbrel with less joint pain and fatigue.  He has had higher blood pressure readings and shortness of breath on exertion noted with Simponi.  I need input from Dr. Dierdre Forth about potentially addressing his Simponi use.  We may need to make changes to his blood pressure medication after the fact, but I need input from Dr. Dierdre Forth first.  Thanks.

## 2023-03-21 NOTE — Telephone Encounter (Signed)
I faxed over the last office notes with your message

## 2023-03-27 ENCOUNTER — Other Ambulatory Visit (HOSPITAL_COMMUNITY): Payer: Self-pay | Admitting: Internal Medicine

## 2023-03-27 DIAGNOSIS — R600 Localized edema: Secondary | ICD-10-CM

## 2023-03-30 ENCOUNTER — Ambulatory Visit: Payer: 59 | Admitting: Internal Medicine

## 2023-03-30 ENCOUNTER — Encounter: Payer: Self-pay | Admitting: Internal Medicine

## 2023-03-30 VITALS — BP 134/88 | HR 78 | Temp 98.6°F | Ht 77.0 in | Wt 318.0 lb

## 2023-03-30 DIAGNOSIS — M10071 Idiopathic gout, right ankle and foot: Secondary | ICD-10-CM | POA: Diagnosis not present

## 2023-03-30 DIAGNOSIS — M109 Gout, unspecified: Secondary | ICD-10-CM | POA: Insufficient documentation

## 2023-03-30 MED ORDER — PREDNISONE 20 MG PO TABS: 40.0000 mg | ORAL_TABLET | Freq: Every day | ORAL | 0 refills | Status: DC

## 2023-03-30 MED ORDER — COLCHICINE 0.6 MG PO TABS: 0.6000 mg | ORAL_TABLET | Freq: Two times a day (BID) | ORAL | 0 refills | Status: DC | PRN

## 2023-03-30 NOTE — Telephone Encounter (Signed)
Spoke with patient and he said that he just saw Dr. Dierdre Forth on 10/24.The notes are viewable in Care Everywhere

## 2023-03-30 NOTE — Progress Notes (Signed)
   Subjective:    Patient ID: Mike Foster, male    DOB: 09-15-1965, 57 y.o.   MRN: [id number]  HPI Here due to gout attack  Started with pain 4 days ago Right foot---points around the 1st MTP This morning was so bad that he couldn't get his foot in his boot Did try some ibuprofen 800mg ---helped only a little  Has used colchicine in the past  Current Outpatient Medications on File Prior to Visit  Medication Sig Dispense Refill   allopurinol (ZYLOPRIM) 100 MG tablet Take 100 mg by mouth daily.     folic acid (FOLVITE) 1 MG tablet Take 1 mg by mouth daily.     furosemide (LASIX) 20 MG tablet Take 1 tablet (20 mg total) by mouth daily as needed for fluid. 30 tablet 1   lisinopril (ZESTRIL) 40 MG tablet Take 1 tablet (40 mg total) by mouth daily. 90 tablet 3   methotrexate (RHEUMATREX) 2.5 MG tablet Take 6 tablets (15 mg total) by mouth once a week. Caution:Chemotherapy. Protect from light.     No current facility-administered medications on file prior to visit.    Allergies  Allergen Reactions   Amlodipine     Lower extremity edema.     Gabapentin     sedation    Past Medical History:  Diagnosis Date   HTN (hypertension)    Psoriatic arthritis (HCC)    on enbrel and mtx   Seizure (HCC)    in childhood; none since 5th grade    Past Surgical History:  Procedure Laterality Date   CERVICAL SPINE SURGERY     Shoulder surgery Left 06/11/2018    Family History  Problem Relation Age of Onset   COPD Mother    Heart disease Mother    COPD Father    Lung cancer Father    Pancreatic cancer Maternal Aunt    Colon cancer Neg Hx    Prostate cancer Neg Hx     Social History   Socioeconomic History   Marital status: Married    Spouse name: Not on file   Number of children: Not on file   Years of education: Not on file   Highest education level: Not on file  Occupational History   Occupation: Plumber  Tobacco Use   Smoking status: Never   Smokeless tobacco: Former   Building services engineer status: Never Used  Substance and Sexual Activity   Alcohol use: Yes    Alcohol/week: 0.0 standard drinks of alcohol    Comment: occasional   Drug use: No   Sexual activity: Not on file  Other Topics Concern   Not on file  Social History Narrative   widowed 2016 (wife had pulmonary fibrosis)   Lives with his stepdaughter   Social Determinants of Health   Financial Resource Strain: Not on file  Food Insecurity: Not on file  Transportation Needs: Not on file  Physical Activity: Not on file  Stress: Not on file  Social Connections: Not on file  Intimate Partner Violence: Not on file   Review of Systems No fever      Objective:   Physical Exam Constitutional:      Appearance: Normal appearance.  Musculoskeletal:     Comments: Tenderness at right 1st MTP--but not much redness Ankle and rest of foot quiet  Neurological:     Mental Status: He is alert.            Assessment & Plan:

## 2023-03-30 NOTE — Telephone Encounter (Signed)
Please check with patient to see if he ever heard any follow-up about this.  I do not see an incoming message from rheumatology.  Thanks.

## 2023-03-30 NOTE — Assessment & Plan Note (Signed)
Uric acid fairly good on the allopurinol Will Rx colchicine 0.6mg  (2 at onset) and then bid prn Will Rx prednisone just in case--he doesn't like it (but should try if not better in 48 hours)

## 2023-03-30 NOTE — Telephone Encounter (Signed)
Noted. Thanks.

## 2023-04-13 ENCOUNTER — Ambulatory Visit (HOSPITAL_COMMUNITY): Payer: 59 | Attending: Cardiology

## 2023-04-13 DIAGNOSIS — R600 Localized edema: Secondary | ICD-10-CM | POA: Diagnosis present

## 2023-04-13 LAB — ECHOCARDIOGRAM COMPLETE
Calc EF: 54.3 %
S' Lateral: 3.09 cm
Single Plane A2C EF: 53.2 %
Single Plane A4C EF: 53.4 %

## 2023-04-13 MED ORDER — PERFLUTREN LIPID MICROSPHERE
1.0000 mL | INTRAVENOUS | Status: AC | PRN
  Administered 2023-04-13: 4 mL via INTRAVENOUS

## 2023-06-04 ENCOUNTER — Telehealth: Payer: Self-pay

## 2023-06-04 NOTE — Telephone Encounter (Signed)
 Message left for patient to call office to reschedule appointment due to weather.

## 2023-06-05 ENCOUNTER — Ambulatory Visit: Payer: 59 | Admitting: Family Medicine

## 2023-06-06 ENCOUNTER — Encounter: Payer: Self-pay | Admitting: Family Medicine

## 2023-06-06 ENCOUNTER — Ambulatory Visit: Payer: 59 | Admitting: Family Medicine

## 2023-06-06 VITALS — BP 144/82 | HR 71 | Temp 97.9°F | Ht 77.0 in | Wt 321.2 lb

## 2023-06-06 DIAGNOSIS — M10071 Idiopathic gout, right ankle and foot: Secondary | ICD-10-CM

## 2023-06-06 DIAGNOSIS — M109 Gout, unspecified: Secondary | ICD-10-CM

## 2023-06-06 LAB — BASIC METABOLIC PANEL
BUN: 17 mg/dL (ref 6–23)
CO2: 26 meq/L (ref 19–32)
Calcium: 9.5 mg/dL (ref 8.4–10.5)
Chloride: 106 meq/L (ref 96–112)
Creatinine, Ser: 1.11 mg/dL (ref 0.40–1.50)
GFR: 72.96 mL/min (ref >60.00)
Glucose, Bld: 100 mg/dL — ABNORMAL HIGH (ref 70–99)
Potassium: 4.3 meq/L (ref 3.5–5.1)
Sodium: 140 meq/L (ref 135–145)

## 2023-06-06 LAB — URIC ACID: Uric Acid, Serum: 6.1 mg/dL (ref 4.0–7.8)

## 2023-06-06 MED ORDER — ALLOPURINOL 200 MG PO TABS: 200.0000 mg | ORAL_TABLET | Freq: Every day | ORAL | 1 refills | Status: DC

## 2023-06-06 MED ORDER — PREDNISONE 20 MG PO TABS: 40.0000 mg | ORAL_TABLET | Freq: Every day | ORAL | 0 refills | Status: DC

## 2023-06-06 NOTE — Patient Instructions (Addendum)
 Please ask the front about a record release from Dr. Dierdre Forth.   Go to the lab on the way out.   If you have mychart we'll likely use that to update you.    Take care.  Glad to see you. Update me early next week, sooner if needed.

## 2023-06-06 NOTE — Progress Notes (Signed)
 I don't have access to rheumatology notes, requesting records.  Discussed with patient.  Med list updated.  He has some dry cough, less than prior.  Off simponi in the meantime.  Still on methotrexate  but off prednisone .    He had joint pain pain and SOBOE while on simponi.  He feels better off med in the meantime.    Taking allopurinol  200mg  per day. He has been having R foot pain for months.  Taking colchicine  daily in the meantime.    Rare use lasix .   Still with R 1st toe but not R 2nd and 3rd toe pain.   Meds, vitals, and allergies reviewed.   ROS: Per HPI unless specifically indicated in ROS section   Nad Ncat Neck supple, no LA Pinkish changes R 1st-3rd MTP Rrr Ctab

## 2023-06-07 NOTE — Assessment & Plan Note (Signed)
 Symptoms could be from gout versus psoriatic arthritis.  Discussed with patient.  In general he feels better off Simponi.  He is off prednisone  but is still on methotrexate  currently.  See notes on labs.  Requesting rheumatology notes.  Would continue allopurinol  and colchicine  in the meantime.

## 2023-06-15 ENCOUNTER — Ambulatory Visit: Payer: Self-pay

## 2023-06-15 ENCOUNTER — Other Ambulatory Visit: Payer: Self-pay | Admitting: Nurse Practitioner

## 2023-06-15 DIAGNOSIS — S61402A Unspecified open wound of left hand, initial encounter: Secondary | ICD-10-CM

## 2023-06-15 DIAGNOSIS — S61412A Laceration without foreign body of left hand, initial encounter: Secondary | ICD-10-CM

## 2023-06-19 ENCOUNTER — Ambulatory Visit: Payer: Self-pay | Admitting: Family Medicine

## 2023-06-19 MED ORDER — PREDNISONE 20 MG PO TABS: 40.0000 mg | ORAL_TABLET | Freq: Every day | ORAL | 0 refills | Status: DC

## 2023-06-19 NOTE — Telephone Encounter (Signed)
Called patient and reviewed all information. Patient verbalized understanding. Will call if any further questions.  

## 2023-06-19 NOTE — Addendum Note (Signed)
Addended by: Joaquim Nam on: 06/19/2023 01:58 PM   Modules accepted: Orders

## 2023-06-19 NOTE — Telephone Encounter (Signed)
Copied from CRM [id number]. Topic: Clinical - Red Word Triage >> Jun 19, 2023  7:56 AM Adele Barthel wrote: Red Word that prompted transfer to Nurse Triage: Patient has been experiencing pain in feets since 10/31 last year. Was originally treated as gout but has been receiving prednisone for inflammation. He is now experiencing foot pain and swelling, lists pain as 11 out of 10. Difficulty walking. He is requesting prednisone refill until he sees his arthritis doctor next week.   Chief Complaint: right foot pain Symptoms: swelling - mid arch Frequency: ongoing since Friday Pertinent Negatives: Patient denies redness  Disposition: [] ED /[] Urgent Care (no appt availability in office) / [x] Appointment(In office/virtual)/ []  [facility]/ [] Home Care/ [] Refused Recommended Disposition /[]  Mobile Bus/ []  Follow-up with PCP Additional Notes: The patient is requesting a prednisone refill until he can be seen by his arthritis doctor for his right foot arthritis pain that he rated 11/10.  He has swelling mid arch, that made it difficult to put on his shoe.  The patient had a recent appointment with Dr. Para March and declined being seen again as his follow up appointment with the arthritis doctor is Tuesday and he is requesting a prednisone refill to relieve the pain until that time.  His pain was unrelieved by ibuprofen and he is having to drag his right foot to get around.  Routed to pcp to further advise. Reason for Disposition  [1] SEVERE pain (e.g., excruciating, unable to do any normal activities) AND [2] not improved after 2 hours of pain medicine  Answer Assessment - Initial Assessment Questions 1. ONSET: "When did the pain start?"      Ongoing 03/30/2023 prednisone calmed it down next Tuesday 2. LOCATION: "Where is the pain located?"      Right foot swollen halfway to the middle of arch  3. PAIN: "How bad is the pain?"    (Scale 1-10; or mild, moderate, severe)  - MILD (1-3): doesn't  interfere with normal activities.   - MODERATE (4-7): interferes with normal activities (e.g., work or school) or awakens from sleep, limping.   - SEVERE (8-10): excruciating pain, unable to do any normal activities, unable to walk.      Dragging foot to walk 11/10 4. WORK OR EXERCISE: "Has there been any recent work or exercise that involved this part of the body?"      No just a flare up from arthritis  5. CAUSE: "What do you think is causing the foot pain?"     Arthritis - quit injection due to SOB and fatigue  6. OTHER SYMPTOMS: "Do you have any other symptoms?" (e.g., leg pain, rash, fever, numbness)     [place]  Protocols used: Foot Pain-A-AH

## 2023-06-19 NOTE — Telephone Encounter (Signed)
Unable to reach patient. Left voicemail to return call to our office.   

## 2023-06-19 NOTE — Telephone Encounter (Signed)
I sent prednisone in the meantime.  Please verify lack of fever/trauma re: foot pain.  I think it makes sense to f/u with arthritis/rheum clinic when possible.  Glad he had appointment pending. Thanks.

## 2023-07-04 ENCOUNTER — Emergency Department (HOSPITAL_BASED_OUTPATIENT_CLINIC_OR_DEPARTMENT_OTHER): Payer: 59 | Admitting: Radiology

## 2023-07-04 ENCOUNTER — Emergency Department (HOSPITAL_BASED_OUTPATIENT_CLINIC_OR_DEPARTMENT_OTHER)
Admission: EM | Admit: 2023-07-04 | Discharge: 2023-07-05 | Disposition: A | Discharge status: Home / Self Care | Payer: 59 | Attending: Emergency Medicine | Admitting: Emergency Medicine

## 2023-07-04 ENCOUNTER — Encounter (HOSPITAL_BASED_OUTPATIENT_CLINIC_OR_DEPARTMENT_OTHER): Payer: Self-pay | Admitting: Emergency Medicine

## 2023-07-04 DIAGNOSIS — R079 Chest pain, unspecified: Secondary | ICD-10-CM | POA: Diagnosis present

## 2023-07-04 DIAGNOSIS — Z79899 Other long term (current) drug therapy: Secondary | ICD-10-CM | POA: Diagnosis not present

## 2023-07-04 DIAGNOSIS — I10 Essential (primary) hypertension: Secondary | ICD-10-CM | POA: Diagnosis not present

## 2023-07-04 LAB — CBC
HCT: 41.1 % (ref 39.0–52.0)
Hemoglobin: 14.2 g/dL (ref 13.0–17.0)
MCH: 32.5 pg (ref 26.0–34.0)
MCHC: 34.5 g/dL (ref 30.0–36.0)
MCV: 94.1 fL (ref 80.0–100.0)
Platelets: 202 10*3/uL (ref 150–400)
RBC: 4.37 MIL/uL (ref 4.22–5.81)
RDW: 13.3 % (ref 11.5–15.5)
WBC: 15.5 10*3/uL — ABNORMAL HIGH (ref 4.0–10.5)
nRBC: 0 % (ref 0.0–0.2)

## 2023-07-04 LAB — BASIC METABOLIC PANEL
Anion gap: 9 (ref 5–15)
BUN: 27 mg/dL — ABNORMAL HIGH (ref 6–20)
CO2: 24 mmol/L (ref 22–32)
Calcium: 9.7 mg/dL (ref 8.9–10.3)
Chloride: 106 mmol/L (ref 98–111)
Creatinine, Ser: 1.42 mg/dL — ABNORMAL HIGH (ref 0.61–1.24)
GFR, Estimated: 57 mL/min — ABNORMAL LOW (ref >60)
Glucose, Bld: 99 mg/dL (ref 70–99)
Potassium: 3.7 mmol/L (ref 3.5–5.1)
Sodium: 139 mmol/L (ref 135–145)

## 2023-07-04 LAB — TROPONIN I (HIGH SENSITIVITY)
Troponin I (High Sensitivity): 11 ng/L (ref <18)
Troponin I (High Sensitivity): 10 ng/L (ref <18)

## 2023-07-04 NOTE — ED Triage Notes (Signed)
Left side chest pain, sharp comes and goes  Started Saturday.  Denies sob, no n/v/d

## 2023-07-04 NOTE — ED Notes (Signed)
Pt brought back to triage for trop recollect. VS were updated. Samples sent. Pt denies any changes in cp since triage/check in. Continues to report 1/10 chest pain.

## 2023-07-05 NOTE — Discharge Instructions (Addendum)
 Take 4 over the counter ibuprofen tablets 3 times a day or 2 over-the-counter naproxen tablets twice a day for pain. Also take tylenol  1000mg (2 extra strength) four times a day.   Please return for worsening symptoms especially if they occur when you are exerting yourself.

## 2023-07-05 NOTE — ED Provider Notes (Signed)
 [facility]ERG[facility]MENT AT [facility] Provider Note   CSN: [id number] Arrival date & time: 07/04/23  1633     History  Chief Complaint  Patient presents with   Chest Pain    Mike Foster is a 58 y.o. male.  58 yo M with a chief complaint of chest pain.  This been going on for about 4 to 5 days now.  Started initially when he was eating.  Felt like an ache in his right arm and has some achy spots in his chest.  Not exertional.  No shortness of breath with it.  Denies cough congestion or fever.  Patient denies history of MI, denies  hyperlipidemia diabetes or smoking.  Mom had some MIs in her 55s.  He has a history of hypertension.    Patient denies history of PE or DVT denies hemoptysis denies unilateral lower extremity edema denies recent surgery immobilization hospitalization estrogen use or history of cancer.     Chest Pain      Home Medications Prior to Admission medications   Medication Sig Start Date End Date Taking? Authorizing Provider  allopurinol  200 MG TABS Take 200 mg by mouth daily. 06/06/23   Solian Arlyss RAMAN, MD  colchicine  0.6 MG tablet Take 1 tablet (0.6 mg total) by mouth 2 (two) times daily as needed. 03/30/23   Jimmy Charlie FERNS, MD  folic acid (FOLVITE) 1 MG tablet Take 1 mg by mouth daily.    [provider]  furosemide  (LASIX ) 20 MG tablet Take 1 tablet (20 mg total) by mouth daily as needed for fluid. 11/09/22   Solian Arlyss RAMAN, MD  lisinopril  (ZESTRIL ) 40 MG tablet Take 1 tablet (40 mg total) by mouth daily. 11/09/22   Solian Arlyss RAMAN, MD  methotrexate  (RHEUMATREX) 2.5 MG tablet Take 6 tablets (15 mg total) by mouth once a week. Caution:Chemotherapy. Protect from light. 10/02/20   Solian Arlyss RAMAN, MD  predniSONE  (DELTASONE ) 20 MG tablet Take 2 tablets (40 mg total) by mouth daily. For 5 days, then 1 tab daily for 5 days.  With food.  Don't take aleve or ibuprofen. 06/19/23   Solian Arlyss RAMAN, MD      Allergies    Amlodipine ,  Gabapentin , and Simponi [golimumab]    Review of Systems   Review of Systems  Cardiovascular:  Positive for chest pain.    Physical Exam Updated Vital Signs BP (!) 150/94   Pulse 71   Temp 98.1 F (36.7 C) (Oral)   Resp 16   SpO2 98%  Physical Exam Vitals and nursing note reviewed.  Constitutional:      Appearance: He is well-developed.  HENT:     Head: Normocephalic and atraumatic.  Eyes:     Pupils: Pupils are equal, round, and reactive to light.  Neck:     Vascular: No JVD.  Cardiovascular:     Rate and Rhythm: Normal rate and regular rhythm.     Heart sounds: No murmur heard.    No friction rub. No gallop.  Pulmonary:     Effort: No respiratory distress.     Breath sounds: No wheezing.  Abdominal:     General: There is no distension.     Tenderness: There is no abdominal tenderness. There is no guarding or rebound.  Musculoskeletal:        General: Normal range of motion.     Cervical back: Normal range of motion and neck supple.  Skin:    Coloration: Skin is  not pale.     Findings: No rash.  Neurological:     Mental Status: He is alert and oriented to person, place, and time.  Psychiatric:        Behavior: Behavior normal.     ED Results / Procedures / Treatments   Labs (all labs ordered are listed, but only abnormal results are displayed) Labs Reviewed  BASIC METABOLIC PANEL - Abnormal; Notable for the following components:      Result Value   BUN 27 (*)    Creatinine, Ser 1.42 (*)    GFR, Estimated 57 (*)    All other components within normal limits  CBC - Abnormal; Notable for the following components:   WBC 15.5 (*)    All other components within normal limits  TROPONIN I (HIGH SENSITIVITY)  TROPONIN I (HIGH SENSITIVITY)    EKG None  Radiology DG Chest 2 View Result Date: 07/04/2023 CLINICAL DATA:  Chest pain. EXAM: CHEST - 2 VIEW COMPARISON:  Chest radiograph dated 02/23/2023. FINDINGS: No focal consolidation, pleural effusion, or  pneumothorax. The cardiac silhouette is within normal limits. No acute osseous pathology. Lower cervical ACDF. IMPRESSION: No active cardiopulmonary disease. Electronically Signed   By: Vanetta Chou M.D.   On: 07/04/2023 17:27    Procedures Procedures    Medications Ordered in ED Medications - No data to display  ED Course/ Medical Decision Making/ A&P                                 Medical Decision Making Amount and/or Complexity of Data Reviewed Labs: ordered. Radiology: ordered.   58 yo M with a chief complaint of chest pain.  This been going on for about 4 days.  Atypical in nature.  Partially reproduced on exam.  Will treat as musculoskeletal.  PCP follow-up.  He had 2 troponins that were negative, no significant electrolyte abnormalities.  Mild leukocytosis.  Chest x-ray independently interpreted by me without focal infiltrate or pneumothorax.            Final Clinical Impression(s) / ED Diagnoses Final diagnoses:  Nonspecific chest pain    Rx / DC Orders ED Discharge Orders     None         Emil Share, DO 07/05/23 0109

## 2023-07-10 ENCOUNTER — Telehealth: Payer: Self-pay

## 2023-07-10 NOTE — Transitions of Care (Post Inpatient/ED Visit) (Signed)
 I spoke with pt;pt seen [facility] ED 07/05/23 foir CP and lt arm pain since 07/01/23.on and off. pt said no pain today but feels some tightness in mid chest and prod cough with clear phlegm. No SOB. Pt said he is in no distress and presently at work. Pt has not missed taking any meds; pt takes Lisinopril  40 mg each morning. On 07/07/23 BP 160/101 P in 70s.pt is presently at work and no way to ck BP. Pt scheduled appt with Dr Vallarie Gauze on 07/11/23 at 11AM with UC & ED precautions and pt voiced understanding. Sending note to Dr Vallarie Gauze. (Pt did note some of theleads were not sticking fto pt when EG was done at ED due to hairy chest.)          07/10/2023  Name: Mike Foster MRN: [id number] DOB: [birth year]  Today's TOC FU Call Status: Today's TOC FU Call Status:: Successful TOC FU Call Completed TOC FU Call Complete Date: 07/10/23 Patient's Name and Date of Birth confirmed.  Transition Care Management Follow-up Telephone Call Date of Discharge: 07/05/23 Discharge Facility: [facility]) Type of Discharge: Emergency Department Reason for ED Visit: Other: (pt seen [facility] ED 07/05/23 foir CP and lt arm pain since 07/01/23.on and off. pt said no pain today but feels some tightness in mid chest and prod cough with clearphlegm. No SOB.) How have you been since you were released from the hospital?: Better Any questions or concerns?: No  Items Reviewed: Did you receive and understand the discharge instructions provided?: Yes Medications obtained,verified, and reconciled?: Yes (Medications Reviewed) Any new allergies since your discharge?: No Dietary orders reviewed?: Yes (pt not eating processed meat and salt intake.) Type of Diet Ordered:: low Na Do you have support at home?: Yes People in Home: child(ren), adult Name of Support/Comfort Primary Source: Edwina Gram  Medications Reviewed Today: Medications Reviewed Today   Medications were not reviewed in this encounter     Home Care  and Equipment/Supplies: Were Home Health Services Ordered?: NA Any new equipment or medical supplies ordered?: NA  Functional Questionnaire: Do you need assistance with bathing/showering or dressing?: No Do you need assistance with meal preparation?: No Do you need assistance with eating?: No Do you have difficulty maintaining continence: No Do you need assistance with getting out of bed/getting out of a chair/moving?: No Do you have difficulty managing or taking your medications?: No  Follow up appointments reviewed: PCP Follow-up appointment confirmed?: Yes Date of PCP follow-up appointment?: 07/11/23 Follow-up Provider: [facility] Follow-up appointment confirmed?: NA Do you need transportation to your follow-up appointment?: No Do you understand care options if your condition(s) worsen?: Yes-patient verbalized understanding    SIGNATURE Claretha Crocker, LPN

## 2023-07-10 NOTE — Telephone Encounter (Signed)
 Noted. Thanks.

## 2023-07-11 ENCOUNTER — Ambulatory Visit: Payer: 59 | Admitting: Family Medicine

## 2023-07-11 ENCOUNTER — Encounter: Payer: Self-pay | Admitting: Family Medicine

## 2023-07-11 VITALS — BP 150/80 | HR 79 | Temp 98.3°F | Ht 77.0 in | Wt 321.8 lb

## 2023-07-11 DIAGNOSIS — I10 Essential (primary) hypertension: Secondary | ICD-10-CM | POA: Diagnosis not present

## 2023-07-11 DIAGNOSIS — D72829 Elevated white blood cell count, unspecified: Secondary | ICD-10-CM | POA: Diagnosis not present

## 2023-07-11 DIAGNOSIS — R0789 Other chest pain: Secondary | ICD-10-CM | POA: Diagnosis not present

## 2023-07-11 DIAGNOSIS — M109 Gout, unspecified: Secondary | ICD-10-CM | POA: Diagnosis not present

## 2023-07-11 LAB — BASIC METABOLIC PANEL
BUN: 14 mg/dL (ref 6–23)
CO2: 25 meq/L (ref 19–32)
Calcium: 8.8 mg/dL (ref 8.4–10.5)
Chloride: 107 meq/L (ref 96–112)
Creatinine, Ser: 1.16 mg/dL (ref 0.40–1.50)
GFR: 69.15 mL/min (ref >60.00)
Glucose, Bld: 101 mg/dL — ABNORMAL HIGH (ref 70–99)
Potassium: 3.9 meq/L (ref 3.5–5.1)
Sodium: 139 meq/L (ref 135–145)

## 2023-07-11 LAB — CBC WITH DIFFERENTIAL/PLATELET
Basophils Absolute: 0.1 10*3/uL (ref 0.0–0.1)
Basophils Relative: 0.8 % (ref 0.0–3.0)
Eosinophils Absolute: 0.2 10*3/uL (ref 0.0–0.7)
Eosinophils Relative: 2.4 % (ref 0.0–5.0)
HCT: 42.9 % (ref 39.0–52.0)
Hemoglobin: 15.1 g/dL (ref 13.0–17.0)
Lymphocytes Relative: 17.2 % (ref 12.0–46.0)
Lymphs Abs: 1.3 10*3/uL (ref 0.7–4.0)
MCHC: 35.1 g/dL (ref 30.0–36.0)
MCV: 96.4 fL (ref 78.0–100.0)
Monocytes Absolute: 0.7 10*3/uL (ref 0.1–1.0)
Monocytes Relative: 9.1 % (ref 3.0–12.0)
Neutro Abs: 5.3 10*3/uL (ref 1.4–7.7)
Neutrophils Relative %: 70.5 % (ref 43.0–77.0)
Platelets: 181 10*3/uL (ref 150.0–400.0)
RBC: 4.45 Mil/uL (ref 4.22–5.81)
RDW: 13.6 % (ref 11.5–15.5)
WBC: 7.5 10*3/uL (ref 4.0–10.5)

## 2023-07-11 MED ORDER — PREDNISONE 20 MG PO TABS: 40.0000 mg | ORAL_TABLET | Freq: Every day | ORAL | 0 refills | Status: DC

## 2023-07-11 MED ORDER — DOXAZOSIN MESYLATE 1 MG PO TABS: 1.0000 mg | ORAL_TABLET | Freq: Every day | ORAL | 3 refills | Status: DC

## 2023-07-11 NOTE — Patient Instructions (Addendum)
Keep prednisone on hand for now.   If you have more trouble then try taking TUMS and let me know.  Take care.  Glad to see you. Go to the lab on the way out.   If you have mychart we'll likely use that to update you.    Add on doxazosin 1mg  per day.  Update me about your BP in about 2 weeks.

## 2023-07-11 NOTE — Progress Notes (Unsigned)
His foot pain is better.  D/w pt about having prednisone on hand if needed, rx to be held for now.  Cautions d/w pt.   ER eval for chest pain.  Had finished eating, was getting in his truck.  Then had L arm pain and chest pain.  Then had R hand tingling.  He had sx at rest.  Went home, had less symptoms after a few hours.  The next day went to ER when sx continued.  Sx not going on now.  Last event was about last week, about 7 days ago.    Not SOB.  No BLE edema.  Not SOB supine.  No fevers, no chills.  Able to exert in the meantime w/o CP.  BP elevation noted.   Meds, vitals, and allergies reviewed.   ROS: Per HPI unless specifically indicated in ROS section   Nad Ncat Neck supple, no LA Rrr ctab L chest wall ttp.   Skin well perfused.  Abd soft, not ttp.

## 2023-07-12 ENCOUNTER — Encounter: Payer: Self-pay | Admitting: Family Medicine

## 2023-07-12 DIAGNOSIS — R0789 Other chest pain: Secondary | ICD-10-CM | POA: Insufficient documentation

## 2023-07-12 NOTE — Assessment & Plan Note (Addendum)
Reproducible, appears to be low risk.   L chest wall ttp.  Could have concurrent muscle strain.   D/w pt about options.  Could have had GERD component.   Could try TUMS if recurrent sx.  Update me as needed.   H/o elevated Cr and WBC, see notes on labs.

## 2023-07-12 NOTE — Assessment & Plan Note (Signed)
His foot pain is better.  D/w pt about having prednisone on hand if needed, rx to be held for now.  Cautions d/w pt.

## 2023-07-12 NOTE — Assessment & Plan Note (Signed)
Add on doxazosin 1mg  per day. Update me about BP in about 2 weeks.

## 2023-07-28 ENCOUNTER — Telehealth: Payer: Self-pay

## 2023-07-28 MED ORDER — DOXAZOSIN MESYLATE 1 MG PO TABS: 2.0000 mg | ORAL_TABLET | Freq: Every day | ORAL | Status: DC

## 2023-07-28 NOTE — Telephone Encounter (Signed)
 Copied from CRM [id number]. Topic: Clinical - Medical Advice >> Jul 28, 2023  8:22 AM Isabell A wrote: Reason for CRM: Patient was told to call and give his blood pressure readings since he started a new medication - doxazosin (CARDURA) 1 MG tablet.  2/24 5:58PM 151/104  2/27 2:15PM 186/106, 3:PM 167/104 2/28 5:30AM 189/126

## 2023-07-28 NOTE — Telephone Encounter (Signed)
 I would try increasing to 2 mg a day.  Let me know if he needs a new rx.  Can take 2 of the 1mg  tabs at the same time.  Please update me about BP in about 10-14 days. Thanks.

## 2023-08-01 NOTE — Telephone Encounter (Signed)
 Left voicemail for patient to return call to office.

## 2023-08-01 NOTE — Telephone Encounter (Signed)
 Patient called back in and relayed message below to him. He stated that he will start taking two of them tomorrow morning.

## 2023-09-07 ENCOUNTER — Other Ambulatory Visit: Payer: Self-pay | Admitting: Family Medicine

## 2023-09-07 MED ORDER — DOXAZOSIN MESYLATE 1 MG PO TABS: 2.0000 mg | ORAL_TABLET | Freq: Every day | ORAL | Status: DC

## 2023-09-07 NOTE — Telephone Encounter (Signed)
 Copied from CRM [id number]. Topic: Clinical - Medication Refill >> Sep 07, 2023 11:09 AM Denese Killings wrote: Most Recent Primary Care Visit:  Provider: Joaquim Nam  Department: [facility]  Visit Type: OFFICE VISIT  Date: 07/11/2023  Medication: doxazosin (CARDURA) 1 MG tablet  Has the patient contacted their pharmacy? Yes (Agent: If no, request that the patient contact the pharmacy for the refill. If patient does not wish to contact the pharmacy document the reason why and proceed with request.) (Agent: If yes, when and what did the pharmacy advise?) no refills  Is this the correct pharmacy for this prescription? Yes If no, delete pharmacy and type the correct one.  This is the patient's preferred pharmacy:  [facility] [address], [place] - [address] AT [facility] [address] [place] [address] Phone: [phone] Fax: [phone]   Has the prescription been filled recently? No  Is the patient out of the medication? Yes been out a week   Has the patient been seen for an appointment in the last year OR does the patient have an upcoming appointment? Yes  Can we respond through MyChart? Yes  Agent: Please be advised that Rx refills may take up to 3 business days. We ask that you follow-up with your pharmacy.

## 2023-09-11 ENCOUNTER — Telehealth: Payer: Self-pay

## 2023-09-11 NOTE — Telephone Encounter (Signed)
 Copied from CRM [id number]. Topic: Clinical - Prescription Issue >> Sep 11, 2023 10:35 AM Howard Macho wrote: Reason for CRM: patient called stating his insurance denied his doxazosin and they will not pay until 4/22. Patient has been out of medication for a week and the doctor increased his medication to taking two a day instead of one a day

## 2023-09-13 NOTE — Telephone Encounter (Signed)
 Is there anything that the patient can take in the meantime.

## 2023-09-14 MED ORDER — DOXAZOSIN MESYLATE 2 MG PO TABS: 2.0000 mg | ORAL_TABLET | Freq: Every day | ORAL | 3 refills | Status: DC

## 2023-09-14 NOTE — Telephone Encounter (Addendum)
 Please notify I have sent in new Rx for doxazosin 2mg  tablets to his pharmacy with the new instructions - to take 1 tablet daily. He should now be able to fill this.

## 2023-09-14 NOTE — Addendum Note (Signed)
 Addended by: Claire Crick on: 09/14/2023 02:26 PM   Modules accepted: Orders

## 2023-09-14 NOTE — Telephone Encounter (Signed)
 Left message on vm, per dpr, relaying Dr Timoteo Expose message.

## 2023-09-17 NOTE — Telephone Encounter (Signed)
 Noted and thanks.

## 2023-10-04 ENCOUNTER — Other Ambulatory Visit: Payer: Self-pay | Admitting: Family Medicine

## 2023-10-04 NOTE — Telephone Encounter (Signed)
 Last Seen 07/11/23; Refill request for Prednisone  20mg ; per last visit note says to hold Rx for the time being; is it okay to refill now?

## 2023-10-04 NOTE — Telephone Encounter (Signed)
 Left voicemail for patient to return call to office.

## 2023-10-04 NOTE — Telephone Encounter (Signed)
 Sent, please get update on patient.  Thanks.

## 2023-10-11 NOTE — Telephone Encounter (Signed)
 Left voicemail for patient to return call to office.

## 2023-11-05 ENCOUNTER — Other Ambulatory Visit: Payer: Self-pay | Admitting: Family Medicine

## 2023-12-22 ENCOUNTER — Other Ambulatory Visit: Payer: Self-pay | Admitting: Family Medicine

## 2024-01-05 ENCOUNTER — Encounter: Payer: Self-pay | Admitting: Family Medicine

## 2024-01-05 ENCOUNTER — Ambulatory Visit
Admission: RE | Admit: 2024-01-05 | Discharge: 2024-01-05 | Disposition: A | Discharge status: Home / Self Care | Source: Ambulatory Visit | Attending: Family Medicine | Admitting: Family Medicine

## 2024-01-05 ENCOUNTER — Ambulatory Visit: Admitting: Family Medicine

## 2024-01-05 VITALS — BP 144/90 | HR 83 | Temp 98.2°F | Ht 77.0 in | Wt 331.8 lb

## 2024-01-05 DIAGNOSIS — M545 Low back pain, unspecified: Secondary | ICD-10-CM

## 2024-01-05 MED ORDER — TRAMADOL HCL 50 MG PO TABS: 50.0000 mg | ORAL_TABLET | Freq: Three times a day (TID) | ORAL | 1 refills | Status: AC | PRN

## 2024-01-05 NOTE — Progress Notes (Signed)
 Not currently on prednisone .   Still on cosentyx.  Per rheum.   Back pain.  Going for last 2-3 months.  Not sleeping well.  Using ice. More pain with prolonged standing/sitting/laying.    Prev MRI with IMPRESSION: 1. Mild progression of degenerative changes at L3-4, now with mild-to-moderate spinal canal stenosis with narrowing of the bilateral subarticular zones, mild right and moderate left neural foraminal narrowing at this level. 2. Stable degenerative changes in the remainder of the lumbar spine. 3. Mild spinal canal stenosis at L2-3 and L4-5.  Last took prednisone  about 2 weeks ago, w/o relief of back pain.  B lower back pain, just above the belt line, in horizontal band.  He can have numbness in the B upper thighs with prolonged standing.  Limping from pain.  No FCNAVD.  H/o missed step coming off a curb, that was recent.  Didn't fall to ground.  More pain after that.    He had prev injection w/o relief.  He was asking for second opinion, prev saw Dr. Darlis with neurosurgery.    Meds, vitals, and allergies reviewed.   ROS: Per HPI unless specifically indicated in ROS section   Nad Ncat Neck supple, no LA Rrr Ctab Abd soft, not ttp S/S grossly intact BLE with pain on crossing his legs.  SLR neg. Lower back ttp B and lower midline.    30 minutes were devoted to patient care in this encounter (this includes time spent reviewing the patient's file/history, interviewing and examining the patient, counseling/reviewing plan with patient).

## 2024-01-05 NOTE — Patient Instructions (Signed)
 Xray on the way out.  I'll work on the referral.  Try tramadol  for pain.  Sedation caution.  Take care.  Glad to see you.

## 2024-01-07 ENCOUNTER — Telehealth: Payer: Self-pay | Admitting: Family Medicine

## 2024-01-07 DIAGNOSIS — M545 Low back pain, unspecified: Secondary | ICD-10-CM | POA: Insufficient documentation

## 2024-01-07 NOTE — Assessment & Plan Note (Signed)
 Reasonable to check plain films today, start tramadol  with sedation caution/routine cautions discussed with patient.  Refer for second opinion.  He agrees with plan.  At this point still okay for outpatient follow-up.

## 2024-01-07 NOTE — Telephone Encounter (Signed)
 Please update patient.  I put in the referral for the second opinion.  He should get a call about that.  I am awaiting the overread on his x-ray.  We will let him know when that comes in.  He has some old mild degenerative changes but I do not see any new findings or fracture.  If tramadol  is not helping let me know.  Thanks.

## 2024-01-08 NOTE — Telephone Encounter (Signed)
 Noted. Thanks.

## 2024-01-08 NOTE — Telephone Encounter (Signed)
 Patient made aware that the referral for the second opinion has been placed. As for the tramadol , its not working. It does not seem to be helping the pain just making him sleepy

## 2024-01-08 NOTE — Telephone Encounter (Signed)
 Patient notified about the hydrocodone . He will just continue taking the tramadol 

## 2024-01-08 NOTE — Telephone Encounter (Signed)
 The next option would be trying a short course of hydrocodone .  It also has the possible side effect of sedation. It may work better for pain but we can't predict that.  If he wants to try that then let me know and I'll send the rx.  Would not take hydrocodone  with tramadol .    Thanks.

## 2024-01-19 ENCOUNTER — Encounter: Payer: Self-pay | Admitting: Physician Assistant

## 2024-01-19 ENCOUNTER — Other Ambulatory Visit: Payer: Self-pay | Admitting: Family Medicine

## 2024-01-19 NOTE — Progress Notes (Signed)
 Referring Physician:  Cleatus Arlyss RAMAN, MD [address] [place],  [place] [address]  Primary Physician:  Mike Arlyss RAMAN, MD  History of Present Illness: 01/24/2024 [name] is here today with a chief complaint of longstanding back pain that has become progressively worse over the past 4 months.  He denies any inciting event.  He has pain across both midline of his back.  He is most bothered by intense numbness and tingling over his right thigh that stops before the knee.  It is worse when he is standing for prolonged periods of time.  His back often feels tired and he also has intermittent cramping.  He previously had lumbar injections in which she did not feel helped very much.  Denies any saddle anesthesia or incontinence to bowel or bladder.    Lean forward will help some  Duration: 4 months Severity: 9/10  Precipitating: aggravated by sitting down, standing Modifying factors: made better by working out Weakness: none Timing: constant Bowel/Bladder Dysfunction: none  Conservative measures: currently going to a chiropractor Physical therapy: has not participated in, but does have a very active job Multimodal medical therapy including regular antiinflammatories: Ibuprofen, Prednisone , Tramadol  Injections:  12/03/2021- L3-L5 TF ESI(no relief) 08/02/2022- L5-S1 ESI (no relief)   Past Surgery:  Neck Surgery early 2000s  Mike Foster has no symptoms of cervical myelopathy.  The symptoms are causing a significant impact on the patient's life.   Review of Systems:  A 10 point review of systems is negative, except for the pertinent positives and negatives detailed in the HPI.  Past Medical History: Past Medical History:  Diagnosis Date   HTN (hypertension)    Psoriatic arthritis (HCC)    on enbrel and mtx   Seizure (HCC)    in childhood; none since 5th grade    Past Surgical History: Past Surgical History:  Procedure Laterality Date   CERVICAL SPINE SURGERY      Shoulder surgery Left 06/11/2018    Allergies: Allergies as of 01/24/2024 - Review Complete 01/24/2024  Allergen Reaction Noted   Amlodipine   11/07/2022   Gabapentin   02/26/2021   Simponi [golimumab]  06/06/2023    Medications: Outpatient Encounter Medications as of 01/24/2024  Medication Sig   allopurinol  200 MG TABS Take 200 mg by mouth daily.   doxazosin  (CARDURA ) 2 MG tablet TAKE 1 TABLET(2 MG) BY MOUTH DAILY   folic acid (FOLVITE) 1 MG tablet Take 1 mg by mouth daily.   furosemide  (LASIX ) 20 MG tablet TAKE 1 TABLET(20 MG) BY MOUTH DAILY AS NEEDED FOR FLUID RETENTION   lisinopril  (ZESTRIL ) 40 MG tablet TAKE 1 TABLET(40 MG) BY MOUTH DAILY   methotrexate  (RHEUMATREX) 2.5 MG tablet Take 6 tablets (15 mg total) by mouth once a week. Caution:Chemotherapy. Protect from light.   predniSONE  (DELTASONE ) 20 MG tablet TAKE 2 TABLETS BY MOUTH DAILY FOR 5 DAYS THEN 1 TABLET BY MOUTH DAILY FOR 5 DAYS. TAKE WITH FOOD. DO NOT TAKE ALEVE/IBUPROFEN   Secukinumab (COSENTYX) 125 MG/5ML SOLN 7 mg.   traMADol  (ULTRAM ) 50 MG tablet Take 1 tablet (50 mg total) by mouth every 8 (eight) hours as needed (sedation caution.).   [DISCONTINUED] colchicine  0.6 MG tablet Take 1 tablet (0.6 mg total) by mouth 2 (two) times daily as needed.   [DISCONTINUED] doxazosin  (CARDURA ) 2 MG tablet Take 1 tablet (2 mg total) by mouth daily.   [DISCONTINUED] furosemide  (LASIX ) 20 MG tablet Take 1 tablet (20 mg total) by mouth daily as needed  for fluid.   No facility-administered encounter medications on file as of 01/24/2024.    Social History: Social History   Tobacco Use   Smoking status: Never   Smokeless tobacco: Former  Building services engineer status: Never Used  Substance Use Topics   Alcohol use: Yes    Alcohol/week: 0.0 standard drinks of alcohol    Comment: occasional   Drug use: No    Family Medical History: Family History  Problem Relation Age of Onset   COPD Mother    Heart disease Mother    COPD  Father    Lung cancer Father    Pancreatic cancer Maternal Aunt    Colon cancer Neg Hx    Prostate cancer Neg Hx     Physical Examination: @VITALWITHPAIN @  General: Patient is well developed, well nourished, calm, collected, and in no apparent distress. Attention to examination is appropriate.  Psychiatric: Patient is non-anxious.  Head:  Pupils equal, round, and reactive to light.  ENT:  Oral mucosa appears well hydrated.  Neck:   Supple.  Full range of motion.  Respiratory: Patient is breathing without any difficulty.  Extremities: No edema.  Vascular: Palpable dorsal pedal pulses.  Skin:   On exposed skin, there are no abnormal skin lesions.  NEUROLOGICAL:     Awake, alert, oriented to person, place, and time.  Speech is clear and fluent. Fund of knowledge is appropriate.   Cranial Nerves: Pupils equal round and reactive to light.  Facial tone is symmetric.   ROM of spine: Tenderness palpation of lumbar paraspinals.  Positive Tinel of right LF CN  Strength:  Side Iliopsoas Quads Hamstring PF DF EHL  R 5 5 5 5 5 5   L 5 5 5 5 5 5       Reflexes are 2+ at the patella, trace achilles.  Clonus is not present.  Toes are down-going.  Bilateral upper and lower extremity sensation is intact to light touch, decreased sensation right lateral femoral cutaneous distribution. Gait is normal.   No difficulty with tandem gait.   No evidence of dysmetria noted.  Medical Decision Making  Imaging: EXAM: LUMBAR SPINE - COMPLETE 4+ VIEW   COMPARISON:  Radiograph 11/23/2020 and MRI 03/22/2021   FINDINGS: No evidence of acute fracture or traumatic listhesis. Mild disc space height loss at L4-L5 and L5-S1. Mild multilevel spondylosis. Moderate facet arthropathy from L3-S1.   IMPRESSION: 1. Mild-to-moderate multilevel spondylosis as described greatest from L3-S1.    EXAM: MRI LUMBAR SPINE WITHOUT CONTRAST (02/2021)   TECHNIQUE: Multiplanar, multisequence MR  imaging of the lumbar spine was performed. No intravenous contrast was administered.   COMPARISON:  MRI of the lumbar spine July 16, 2014.   FINDINGS: Segmentation:  Standard.   Alignment:  Physiologic.   Vertebrae: No fracture, evidence of discitis, or bone lesion. A congenitally small spinal canal is noted.   Conus medullaris and cauda equina: Conus extends to the L1 level. Conus and cauda equina appear normal.   Paraspinal and other soft tissues: Left renal cyst, only partially visualized.   Disc levels:   T12-L1: No spinal canal or neural foraminal stenosis.   L1-2: Mild facet degenerative changes. No significant spinal canal or neural foraminal stenosis.   L2-3: Shallow disc bulge with superimposed left far lateral disc protrusion, moderate hypertrophic facet degenerative changes ligamentum flavum redundancy resulting in mild spinal canal stenosis. No significant neural foraminal narrowing. No significant change from prior.   L3-4: Disc bulge with superimposed small left  foraminal/far lateral disc protrusion, hypertrophic facet degenerative changes ligamentum flavum redundancy resulting in mild-to-moderate spinal canal stenosis with narrowing of the bilateral subarticular zones, mild right and moderate left neural foraminal narrowing. Findings have progressed since prior MRI.   L4-5: Disc bulge, moderate facet degenerative changes and mild ligamentum flavum redundancy resulting in mild spinal canal stenosis and mild bilateral neural foraminal narrowing. No significant change from prior.   L5-S1: Loss of disc height, disc bulge, mildly asymmetric to the right with right far lateral osteophytic component and mild-to-moderate facet degenerative changes. Findings result in mild narrowing of the right subarticular zone and mild right neural foraminal narrowing. No significant change from prior.   IMPRESSION: 1. Mild progression of degenerative changes at L3-4,  now with mild-to-moderate spinal canal stenosis with narrowing of the bilateral subarticular zones, mild right and moderate left neural foraminal narrowing at this level. 2. Stable degenerative changes in the remainder of the lumbar spine. 3. Mild spinal canal stenosis at L2-3 and L4-5.      I have personally reviewed the images and agree with the above interpretation.  Assessment and Plan: Mr. Riel is a pleasant 58 y.o. male is here today with a chief complaint of longstanding back pain that has become progressively worse over the past 4 months.  He denies any inciting event.  He has pain across both midline of his back.  He is most bothered by intense numbness and tingling over his right thigh that stops before the knee.  It is worse when he is standing for prolonged periods of time.  His back often feels tired and he also has intermittent cramping.  He previously had lumbar injections in which he did not feel helped very much.  Full strength in bilateral lower extremities.  Decreased sensation in right lateral femoral cutaneous nerve distribution.  Tenderness of patient of lumbar paraspinals.  Recent x-ray and previous MRI from 3 years ago reviewed.  Pleasure to see patient in clinic today.  Patient likely has both lumbar stenosis and right meralgia paresthetica.  Plan moving forward includes the following:  -Referral to pain clinic for right LF CN injection.  Discussed with patient different options to try to decrease his right meralgia paresthetica pain including his weight and belt wearing - Dynamic films of the spine to evaluate for listhesis .-MRI lumbar spine to-increasing back pain for years or months without improvement -PT referral placed Robaxin  given for muscle spasms and pain.  Side effects of this medication including drowsiness were discussed at length.  Risk reviewed. - Plan to review results once complete and see back in 8 weeks.   Thank you for involving me in the care  of this patient.   I spent a total of 45 minutes in both face-to-face and non-face-to-face activities for this visit on the date of this encounter including preparing to see the patient, obtaining and reviewing separately obtained history, performing medically appropriate examination, counseling the patient and their family, ordering additional medications and tests, documenting clinical information, independently interpreting results, coordination of care.   Lyle Decamp, PA-C Dept. of Neurosurgery

## 2024-01-21 ENCOUNTER — Ambulatory Visit: Payer: Self-pay | Admitting: Family Medicine

## 2024-01-22 ENCOUNTER — Other Ambulatory Visit: Payer: Self-pay | Admitting: Family Medicine

## 2024-01-24 ENCOUNTER — Encounter: Payer: Self-pay | Admitting: Physician Assistant

## 2024-01-24 ENCOUNTER — Ambulatory Visit: Admitting: Physician Assistant

## 2024-01-24 VITALS — BP 156/90 | Ht 77.0 in | Wt 330.0 lb

## 2024-01-24 DIAGNOSIS — G5711 Meralgia paresthetica, right lower limb: Secondary | ICD-10-CM

## 2024-01-24 DIAGNOSIS — M5440 Lumbago with sciatica, unspecified side: Secondary | ICD-10-CM

## 2024-01-24 DIAGNOSIS — M549 Dorsalgia, unspecified: Secondary | ICD-10-CM | POA: Diagnosis not present

## 2024-01-24 MED ORDER — METHOCARBAMOL 500 MG PO TABS: 500.0000 mg | ORAL_TABLET | Freq: Three times a day (TID) | ORAL | 1 refills | Status: AC | PRN

## 2024-01-25 ENCOUNTER — Encounter: Payer: Self-pay | Admitting: Physician Assistant

## 2024-02-02 ENCOUNTER — Ambulatory Visit
Admission: RE | Admit: 2024-02-02 | Discharge: 2024-02-02 | Disposition: A | Discharge status: Home / Self Care | Source: Ambulatory Visit | Attending: Physician Assistant | Admitting: Physician Assistant

## 2024-02-02 DIAGNOSIS — M5440 Lumbago with sciatica, unspecified side: Secondary | ICD-10-CM

## 2024-02-06 ENCOUNTER — Encounter: Payer: Self-pay | Admitting: Physician Assistant

## 2024-02-10 ENCOUNTER — Ambulatory Visit
Admission: RE | Admit: 2024-02-10 | Discharge: 2024-02-10 | Disposition: A | Discharge status: Home / Self Care | Source: Ambulatory Visit | Attending: Physician Assistant | Admitting: Physician Assistant

## 2024-02-12 ENCOUNTER — Ambulatory Visit: Payer: Self-pay | Admitting: Physician Assistant

## 2024-02-13 ENCOUNTER — Ambulatory Visit: Admitting: Physician Assistant

## 2024-02-13 DIAGNOSIS — M5126 Other intervertebral disc displacement, lumbar region: Secondary | ICD-10-CM | POA: Diagnosis not present

## 2024-02-13 DIAGNOSIS — G5711 Meralgia paresthetica, right lower limb: Secondary | ICD-10-CM

## 2024-02-13 MED ORDER — GABAPENTIN 100 MG PO CAPS: 100.0000 mg | ORAL_CAPSULE | Freq: Two times a day (BID) | ORAL | 2 refills | Status: AC

## 2024-02-13 NOTE — Progress Notes (Unsigned)
 Maybe meraglia paresthetica  Comes and goes, robaxin  not elping, tramadol  taking. Tylenol  as well   Injection? PT? Robaxin ?  Dr marcelino- message    L3 nerve

## 2024-02-20 ENCOUNTER — Encounter: Payer: Self-pay | Admitting: Student in an Organized Health Care Education/Training Program

## 2024-02-20 ENCOUNTER — Ambulatory Visit
Attending: Student in an Organized Health Care Education/Training Program | Admitting: Student in an Organized Health Care Education/Training Program

## 2024-02-20 VITALS — BP 176/98 | HR 83 | Temp 98.1°F | Resp 16 | Ht >= 80 in | Wt 330.0 lb

## 2024-02-20 DIAGNOSIS — G5712 Meralgia paresthetica, left lower limb: Secondary | ICD-10-CM | POA: Diagnosis not present

## 2024-02-20 DIAGNOSIS — M79604 Pain in right leg: Secondary | ICD-10-CM | POA: Diagnosis present

## 2024-02-20 DIAGNOSIS — G5711 Meralgia paresthetica, right lower limb: Secondary | ICD-10-CM | POA: Diagnosis not present

## 2024-02-20 NOTE — Progress Notes (Signed)
 Safety precautions to be maintained throughout the outpatient stay will include: orient to surroundings, keep bed in low position, maintain call bell within reach at all times, provide assistance with transfer out of bed and ambulation.

## 2024-02-20 NOTE — Progress Notes (Signed)
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 2015-04-16   Death of wife 2015/10/09   Colon cancer screening 10-09-2015   Rash and nonspecific skin eruption 02/10/2016   Cellulitis 02/20/2016   Right-sided chest wall pain 11/13/2016   Influenza A 04/07/2021   Hearing loss of left ear due to cerumen impaction 10/04/2021   Lightheadedness 04/07/2022   Past Medical History:  Diagnosis Date   HTN (hypertension)    Psoriatic arthritis (HCC)    Seizure (HCC)    Constitutional Exam  General appearance: Well nourished, well developed, and well hydrated. In no apparent acute distress Vitals:   02/20/24 0913  BP: (!) 176/98   Pulse: 83  Resp: 16  Temp: 98.1 F (36.7 C)  SpO2: 98%  Weight: (!) 330 lb (149.7 kg)  Height: 6' 8 (2.032 m)   BMI Assessment: Estimated body mass index is 36.25 kg/m as calculated from the following:   Height as of this encounter: 6' 8 (2.032 m).   Weight as of this encounter: 330 lb (149.7 kg).  BMI interpretation table: BMI level Category Range association with higher incidence of chronic pain  <18 kg/m2 Underweight   18.5-24.9 kg/m2 Ideal body weight   25-29.9 kg/m2 Overweight Increased incidence by 20%  30-34.9 kg/m2 Obese (Class I) Increased incidence by 68%  35-39.9 kg/m2 Severe obesity (  Class II) Increased incidence by 136%  >40 kg/m2 Extreme obesity (Class III) Increased incidence by 254%   Patient's current BMI Ideal Body weight  Body mass index is 36.25 kg/m. Ideal body weight: 96 kg (211 lb 10.3 oz) Adjusted ideal body weight: 117.5 kg (258 lb 15.8 oz)   BMI Readings from Last 4 Encounters:  02/20/24 36.25 kg/m  01/24/24 39.13 kg/m  01/05/24 39.35 kg/m  07/11/23 38.16 kg/m   Wt Readings from Last 4 Encounters:  02/20/24 (!) 330 lb (149.7 kg)  01/24/24 (!) 330 lb (149.7 kg)  01/05/24 (!) 331 lb 12.8 oz (150.5 kg)  07/11/23 (!) 321 lb 12.8 oz (146 kg)    Psych/Mental status: Alert, oriented x 3 (person, place, & time)       Eyes: PERLA Respiratory: No evidence of acute respiratory distress  Lumbar Spine Area Exam  Skin & Axial Inspection: No masses, redness, or swelling Alignment: Symmetrical Functional ROM: Unrestricted ROM       Stability: No instability detected Muscle Tone/Strength: Functionally intact. No obvious neuro-muscular anomalies detected. Sensory (Neurological): Unimpaired Palpation: No palpable anomalies       Provocative Tests: Hyperextension/rotation test: deferred today       Lumbar quadrant test (Kemp's test): deferred today       Lateral bending test: (+) due to pain. Patrick's Maneuver: (+) for bilateral S-I arthralgia              FABER* test: (+) for bilateral S-I arthralgia              Gait & Posture Assessment  Ambulation: Unassisted Gait: Relatively normal for age and body habitus Posture: WNL  Lower Extremity Exam    Side: Right lower extremity  Side: Left lower extremity  Stability: No instability observed          Stability: No instability observed          Skin & Extremity Inspection: Skin color, temperature, and hair growth are WNL. No peripheral edema or cyanosis. No masses, redness, swelling, asymmetry, or associated skin lesions. No contractures.  Skin & Extremity Inspection: Skin color, temperature, and hair growth are WNL. No peripheral edema or cyanosis. No masses, redness, swelling, asymmetry, or associated skin lesions. No contractures.  Functional ROM: Unrestricted ROM                  Functional ROM: Unrestricted ROM                  Muscle Tone/Strength: Functionally intact. No obvious neuro-muscular anomalies detected.  Muscle Tone/Strength: Functionally intact. No obvious neuro-muscular anomalies detected.  Sensory (Neurological): Unimpaired        Sensory (Neurological): Unimpaired        DTR: Patellar: deferred today Achilles: deferred today Plantar: deferred today  DTR: Patellar: deferred today Achilles: deferred today Plantar: deferred today  Palpation: No palpable anomalies  Palpation: No palpable anomalies    Assessment  Primary Diagnosis & Pertinent Problem List: The primary encounter diagnosis was Meralgia paraesthetica, right. Diagnoses of Right leg pain and Meralgia paresthetica of left side were also pertinent to this visit.  Visit Diagnosis (New problems to examiner): 1. Meralgia paraesthetica, right   2. Right leg pain   3. Meralgia paresthetica of left side    Plan of Care (Initial workup plan)  General Recommendations: The pain condition that the patient suffers from is best treated with a multidisciplinary approach that involves an increase in physical  activity to prevent de-conditioning and worsening  of the pain cycle, as well as psychological counseling (formal and/or informal) to address the co-morbid psychological affects of pain. Treatment will often involve judicious use nterventional procedures to decrease the pain, allowing the patient to participate in the physical activity that will ultimately produce long-lasting pain reductions. The goal of the multidisciplinary approach is to return the patient to a higher level of overall function and to restore their ability to perform activities of daily living.   The patient presents with anterior lateral leg pain that originates in the lower back. He has previously undergone lumbar epidural injections with only limited response. Lumbar MRI demonstrates degenerative disc disease with associated facet changes. He has been referred by neurosurgery for further evaluation and management, and given the clinical picture, a diagnostic lateral femoral cutaneous nerve block is indicated to evaluate for meralgia paresthetica. Differential diagnosis also includes sacroiliac joint dysfunction and piriformis syndrome. At this time, we will proceed with a diagnostic lateral femoral cutaneous nerve block to clarify the source of pain and guide subsequent treatment options.  Procedure Orders         FEMORAL NERVE BLOCK      Future considerations include diagnostic SI joint injection, piriformis injection.  Patient also has facet changes on his lumbar spine to consider diagnostic lumbar facet medial branch nerve blocks  Provider-requested follow-up: Return in about 15 days (around 03/06/2024) for B/L LFCN block (U/S), in clinic (PO Valium 10mg ).  Future Appointments  Date Time Provider Department Center  03/06/2024  1:40 PM Marcelino Nurse, MD [facility] None   I discussed the assessment and treatment plan with the patient. The patient was provided an opportunity to ask questions and all were answered. The patient  agreed with the plan and demonstrated an understanding of the instructions.  Patient advised to call back or seek an in-person evaluation if the symptoms or condition worsens.  Duration of encounter: .  Total time on encounter, as per AMA guidelines included both the face-to-face and non-face-to-face time personally spent by the physician and/or other qualified health care professional(s) on the day of the encounter (includes time in activities that require the physician or other qualified health care professional and does not include time in activities normally performed by clinical staff). Physician's time may include the following activities when performed: Preparing to see the patient (e.g., pre-charting review of records, searching for previously ordered imaging, lab work, and nerve conduction tests) Review of prior analgesic pharmacotherapies. Reviewing PMP Interpreting ordered tests (e.g., lab work, imaging, nerve conduction tests) Performing post-procedure evaluations, including interpretation of diagnostic procedures Obtaining and/or reviewing separately obtained history Performing a medically appropriate examination and/or evaluation Counseling and educating the patient/family/caregiver Ordering medications, tests, or procedures Referring and communicating with other health care professionals (when not separately reported) Documenting clinical information in the electronic or other health record Independently interpreting results (not separately reported) and communicating results to the patient/ family/caregiver Care coordination (not separately reported)  Note by: Nurse Marcelino, MD (TTS and AI technology used. I apologize for any typographical errors that were not detected and corrected.) Date: 02/20/2024; Time: 10:44 AM

## 2024-02-20 NOTE — Patient Instructions (Signed)
 GENERAL RISKS AND COMPLICATIONS  What are the risk, side effects and possible complications? Generally speaking, most procedures are safe.  However, with any procedure there are risks, side effects, and the possibility of complications.  The risks and complications are dependent upon the sites that are lesioned, or the type of nerve block to be performed.  The closer the procedure is to the spine, the more serious the risks are.  Great care is taken when placing the radio frequency needles, block needles or lesioning probes, but sometimes complications can occur. Infection: Any time there is an injection through the skin, there is a risk of infection.  This is why sterile conditions are used for these blocks.  There are four possible types of infection. Localized skin infection. Central Nervous System Infection-This can be in the form of Meningitis, which can be deadly. Epidural Infections-This can be in the form of an epidural abscess, which can cause pressure inside of the spine, causing compression of the spinal cord with subsequent paralysis. This would require an emergency surgery to decompress, and there are no guarantees that the patient would recover from the paralysis. Discitis-This is an infection of the intervertebral discs.  It occurs in about 1% of discography procedures.  It is difficult to treat and it may lead to surgery.        2. Pain: the needles have to go through skin and soft tissues, will cause soreness.       3. Damage to internal structures:  The nerves to be lesioned may be near blood vessels or    other nerves which can be potentially damaged.       4. Bleeding: Bleeding is more common if the patient is taking blood thinners such as  aspirin, Coumadin, Ticiid, Plavix, etc., or if he/she have some genetic predisposition  such as hemophilia. Bleeding into the spinal canal can cause compression of the spinal  cord with subsequent paralysis.  This would require an emergency  surgery to  decompress and there are no guarantees that the patient would recover from the  paralysis.       5. Pneumothorax:  Puncturing of a lung is a possibility, every time a needle is introduced in  the area of the chest or upper back.  Pneumothorax refers to free air around the  collapsed lung(s), inside of the thoracic cavity (chest cavity).  Another two possible  complications related to a similar event would include: Hemothorax and Chylothorax.   These are variations of the Pneumothorax, where instead of air around the collapsed  lung(s), you may have blood or chyle, respectively.       6. Spinal headaches: They may occur with any procedures in the area of the spine.       7. Persistent CSF (Cerebro-Spinal Fluid) leakage: This is a rare problem, but may occur  with prolonged intrathecal or epidural catheters either due to the formation of a fistulous  track or a dural tear.       8. Nerve damage: By working so close to the spinal cord, there is always a possibility of  nerve damage, which could be as serious as a permanent spinal cord injury with  paralysis.       9. Death:  Although rare, severe deadly allergic reactions known as "Anaphylactic  reaction" can occur to any of the medications used.      10. Worsening of the symptoms:  We can always make thing worse.  What are the chances  of something like this happening? Chances of any of this occuring are extremely low.  By statistics, you have more of a chance of getting killed in a motor vehicle accident: while driving to the hospital than any of the above occurring .  Nevertheless, you should be aware that they are possibilities.  In general, it is similar to taking a shower.  Everybody knows that you can slip, hit your head and get killed.  Does that mean that you should not shower again?  Nevertheless always keep in mind that statistics do not mean anything if you happen to be on the wrong side of them.  Even if a procedure has a 1 (one) in a  1,000,000 (million) chance of going wrong, it you happen to be that one..Also, keep in mind that by statistics, you have more of a chance of having something go wrong when taking medications.  Who should not have this procedure? If you are on a blood thinning medication (e.g. Coumadin, Plavix, see list of "Blood Thinners"), or if you have an active infection going on, you should not have the procedure.  If you are taking any blood thinners, please inform your physician.  How should I prepare for this procedure? Do not eat or drink anything at least six hours prior to the procedure. Bring a driver with you .  It cannot be a taxi. Come accompanied by an adult that can drive you back, and that is strong enough to help you if your legs get weak or numb from the local anesthetic. Take all of your medicines the morning of the procedure with just enough water to swallow them. If you have diabetes, make sure that you are scheduled to have your procedure done first thing in the morning, whenever possible. If you have diabetes, take only half of your insulin dose and notify our nurse that you have done so as soon as you arrive at the clinic. If you are diabetic, but only take blood sugar pills (oral hypoglycemic), then do not take them on the morning of your procedure.  You may take them after you have had the procedure. Do not take aspirin or any aspirin-containing medications, at least eleven (11) days prior to the procedure.  They may prolong bleeding. Wear loose fitting clothing that may be easy to take off and that you would not mind if it got stained with Betadine or blood. Do not wear any jewelry or perfume Remove any nail coloring.  It will interfere with some of our monitoring equipment.  NOTE: Remember that this is not meant to be interpreted as a complete list of all possible complications.  Unforeseen problems may occur.  BLOOD THINNERS The following drugs contain aspirin or other products,  which can cause increased bleeding during surgery and should not be taken for 2 weeks prior to and 1 week after surgery.  If you should need take something for relief of minor pain, you may take acetaminophen which is found in Tylenol,m Datril, Anacin-3 and Panadol. It is not blood thinner. The products listed below are.  Do not take any of the products listed below in addition to any listed on your instruction sheet.  A.P.C or A.P.C with Codeine Codeine Phosphate Capsules #3 Ibuprofen Ridaura  ABC compound Congesprin Imuran rimadil  Advil Cope Indocin Robaxisal  Alka-Seltzer Effervescent Pain Reliever and Antacid Coricidin or Coricidin-D  Indomethacin Rufen  Alka-Seltzer plus Cold Medicine Cosprin Ketoprofen S-A-C Tablets  Anacin Analgesic Tablets or Capsules Coumadin  Korlgesic Salflex  Anacin Extra Strength Analgesic tablets or capsules CP-2 Tablets Lanoril Salicylate  Anaprox Cuprimine Capsules Levenox Salocol  Anexsia-D Dalteparin Magan Salsalate  Anodynos Darvon compound Magnesium Salicylate Sine-off  Ansaid Dasin Capsules Magsal Sodium Salicylate  Anturane Depen Capsules Marnal Soma  APF Arthritis pain formula Dewitt's Pills Measurin Stanback  Argesic Dia-Gesic Meclofenamic Sulfinpyrazone  Arthritis Bayer Timed Release Aspirin Diclofenac Meclomen Sulindac  Arthritis pain formula Anacin Dicumarol Medipren Supac  Analgesic (Safety coated) Arthralgen Diffunasal Mefanamic Suprofen  Arthritis Strength Bufferin Dihydrocodeine Mepro Compound Suprol  Arthropan liquid Dopirydamole Methcarbomol with Aspirin Synalgos  ASA tablets/Enseals Disalcid Micrainin Tagament  Ascriptin Doan's Midol Talwin  Ascriptin A/D Dolene Mobidin Tanderil  Ascriptin Extra Strength Dolobid Moblgesic Ticlid  Ascriptin with Codeine Doloprin or Doloprin with Codeine Momentum Tolectin  Asperbuf Duoprin Mono-gesic Trendar  Aspergum Duradyne Motrin or Motrin IB Triminicin  Aspirin plain, buffered or enteric coated  Durasal Myochrisine Trigesic  Aspirin Suppositories Easprin Nalfon Trillsate  Aspirin with Codeine Ecotrin Regular or Extra Strength Naprosyn Uracel  Atromid-S Efficin Naproxen Ursinus  Auranofin Capsules Elmiron Neocylate Vanquish  Axotal Emagrin Norgesic Verin  Azathioprine Empirin or Empirin with Codeine Normiflo Vitamin E  Azolid Emprazil Nuprin Voltaren  Bayer Aspirin plain, buffered or children's or timed BC Tablets or powders Encaprin Orgaran Warfarin Sodium  Buff-a-Comp Enoxaparin Orudis Zorpin  Buff-a-Comp with Codeine Equegesic Os-Cal-Gesic   Buffaprin Excedrin plain, buffered or Extra Strength Oxalid   Bufferin Arthritis Strength Feldene Oxphenbutazone   Bufferin plain or Extra Strength Feldene Capsules Oxycodone with Aspirin   Bufferin with Codeine Fenoprofen Fenoprofen Pabalate or Pabalate-SF   Buffets II Flogesic Panagesic   Buffinol plain or Extra Strength Florinal or Florinal with Codeine Panwarfarin   Buf-Tabs Flurbiprofen Penicillamine   Butalbital Compound Four-way cold tablets Penicillin   Butazolidin Fragmin Pepto-Bismol   Carbenicillin Geminisyn Percodan   Carna Arthritis Reliever Geopen Persantine   Carprofen Gold's salt Persistin   Chloramphenicol Goody's Phenylbutazone   Chloromycetin Haltrain Piroxlcam   Clmetidine heparin Plaquenil   Cllnoril Hyco-pap Ponstel   Clofibrate Hydroxy chloroquine Propoxyphen         Before stopping any of these medications, be sure to consult the physician who ordered them.  Some, such as Coumadin (Warfarin) are ordered to prevent or treat serious conditions such as "deep thrombosis", "pumonary embolisms", and other heart problems.  The amount of time that you may need off of the medication may also vary with the medication and the reason for which you were taking it.  If you are taking any of these medications, please make sure you notify your pain physician before you undergo any procedures.         Selective Nerve Root  Block Patient Information  Description: Specific nerve roots exit the spinal canal and these nerves can be compressed and inflamed by a bulging disc and bone spurs.  By injecting steroids on the nerve root, we can potentially decrease the inflammation surrounding these nerves, which often leads to decreased pain.  Also, by injecting local anesthesia on the nerve root, this can provide [place] helpful information to give to your referring doctor if it decreases your pain.  Selective nerve root blocks can be done along the spine from the neck to the low back depending on the location of your pain.   After numbing the skin with local anesthesia, a small needle is passed to the nerve root and the position of the needle is verified using x-ray pictures.  After the needle is  in correct position, we then deposit the medication.  You may experience a pressure sensation while this is being done.  The entire block usually lasts less than 15 minutes.  Conditions that may be treated with selective nerve root blocks: Low back and leg pain Spinal stenosis Diagnostic block prior to potential surgery Neck and arm pain Post laminectomy syndrome  Preparation for the injection:  Do not eat any solid food or dairy products within 8 hours of your appointment. You may drink clear liquids up to 3 hours before an appointment.  Clear liquids include water, black coffee, juice or soda.  No milk or cream please. You may take your regular medications, including pain medications, with a sip of water before your appointment.  Diabetics should hold regular insulin (if taken separately) and take 1/2 normal NPH dose the morning of the procedure.  Carry some sugar containing items with you to your appointment. A driver must accompany you and be prepared to drive you home after your procedure. Bring all your current medications with you. An IV may be inserted and sedation may be given at the discretion of the physician. A blood  pressure cuff, EKG, and other monitors will often be applied during the procedure.  Some patients may need to have extra oxygen administered for a short period. You will be asked to provide medical information, including allergies, prior to the procedure.  We must know immediately if you are taking blood  Thinners (like Coumadin) or if you are allergic to IV iodine contrast (dye).  Possible side-effects: All are usually temporary Bleeding from needle site Light headedness Numbness and tingling Decreased blood pressure Weakness in arms/legs Pressure sensation in back/neck Pain at injection site (several days)  Possible complications: All are extremely rare Infection Nerve injury Spinal headache (a headache wore with upright position)  Call if you experience: Fever/chills associated with headache or increased back/neck pain Headache worsened by an upright position New onset weakness or numbness of an extremity below the injection site Hives or difficulty breathing (go to the emergency room) Inflammation or drainage at the injection site(s) Severe back/neck pain greater than usual New symptoms which are concerning to you  Please note:  Although the local anesthetic injected can often make your back or neck feel good for several hours after the injection the pain will likely return.  It takes 3-5 days for steroids to work on the nerve root. You may not notice any pain relief for at least one week.  If effective, we will often do a series of 3 injections spaced 3-6 weeks apart to maximally decrease your pain.    If you have any questions, please call [phone] [facility]

## 2024-03-06 ENCOUNTER — Encounter: Payer: Self-pay | Admitting: Student in an Organized Health Care Education/Training Program

## 2024-03-06 ENCOUNTER — Ambulatory Visit
Attending: Student in an Organized Health Care Education/Training Program | Admitting: Student in an Organized Health Care Education/Training Program

## 2024-03-06 VITALS — BP 161/89 | HR 73 | Temp 99.3°F | Resp 18 | Ht >= 80 in | Wt 330.0 lb

## 2024-03-06 DIAGNOSIS — G5711 Meralgia paresthetica, right lower limb: Secondary | ICD-10-CM

## 2024-03-06 DIAGNOSIS — G5712 Meralgia paresthetica, left lower limb: Secondary | ICD-10-CM

## 2024-03-06 DIAGNOSIS — M25551 Pain in right hip: Secondary | ICD-10-CM | POA: Insufficient documentation

## 2024-03-06 DIAGNOSIS — Z79899 Other long term (current) drug therapy: Secondary | ICD-10-CM | POA: Insufficient documentation

## 2024-03-06 DIAGNOSIS — M79604 Pain in right leg: Secondary | ICD-10-CM

## 2024-03-06 DIAGNOSIS — G5713 Meralgia paresthetica, bilateral lower limbs: Secondary | ICD-10-CM | POA: Insufficient documentation

## 2024-03-06 DIAGNOSIS — M25552 Pain in left hip: Secondary | ICD-10-CM | POA: Diagnosis not present

## 2024-03-06 MED ORDER — LIDOCAINE HCL 2 % IJ SOLN
INTRAMUSCULAR | Status: AC
  Filled 2024-03-06: qty 20

## 2024-03-06 MED ORDER — ROPIVACAINE HCL 2 MG/ML IJ SOLN
18.0000 mL | Freq: Once | INTRAMUSCULAR | Status: AC
  Administered 2024-03-06: 20 mL via PERINEURAL

## 2024-03-06 MED ORDER — LIDOCAINE HCL 2 % IJ SOLN
20.0000 mL | Freq: Once | INTRAMUSCULAR | Status: AC
  Administered 2024-03-06: 400 mg

## 2024-03-06 MED ORDER — DEXAMETHASONE SODIUM PHOSPHATE 10 MG/ML IJ SOLN
INTRAMUSCULAR | Status: AC
  Filled 2024-03-06: qty 2

## 2024-03-06 MED ORDER — DEXAMETHASONE SODIUM PHOSPHATE 10 MG/ML IJ SOLN
20.0000 mg | Freq: Once | INTRAMUSCULAR | Status: AC
  Administered 2024-03-06: 10 mg

## 2024-03-06 MED ORDER — ROPIVACAINE HCL 2 MG/ML IJ SOLN
INTRAMUSCULAR | Status: AC
  Filled 2024-03-06: qty 20

## 2024-03-06 NOTE — Patient Instructions (Signed)

## 2024-03-06 NOTE — Progress Notes (Signed)
 PROVIDER NOTE: Interpretation of information contained herein should be left to medically-trained personnel. Specific patient instructions are provided elsewhere under Patient Instructions section of medical record. This document was created in part using STT-dictation technology, any transcriptional errors that may result from this process are unintentional.  Patient: Mike Foster Type: Established DOB: 1965-11-11 MRN: [id number] PCP: Solian Arlyss RAMAN, MD  Service: Procedure DOS: 03/06/2024 Setting: Ambulatory Location: Ambulatory outpatient facility Delivery: Face-to-face Provider: Wallie Sherry, MD Specialty: Interventional Pain Management Specialty designation: 09 Location: Outpatient facility Ref. Prov.: Solian Arlyss RAMAN, MD       Interventional Therapy   Primary Reason for Visit: Interventional Pain Management Treatment. CC: Hip Pain (Bilateral )    Procedure:          Anesthesia, Analgesia, Anxiolysis:  Type: Bilateral, lateral femoral cutaneous nerve block Primary Purpose: Diagnostic Region: Groin Region, 1 cm inferior and 1 cm to ASIS Approach: Anterior approach Laterality: Bilateral  Anesthesia: Local (1-2% Lidocaine)  Anxiolysis: None  Sedation: None  Guidance: Ultrasound           Position: Supine   1. Meralgia paraesthetica, right   2. Meralgia paresthetica of left side   3. Right leg pain    NAS-11 Pain score:   Pre-procedure: 7 /10   Post-procedure: 2 /10     H&P (Pre-op Assessment):  Mr. Kornegay is a 58 y.o. (year old), male patient, seen today for interventional treatment. He  has a past surgical history that includes Cervical spine surgery and Shoulder surgery (Left, 06/11/2018). Mr. Cashman has a current medication list which includes the following prescription(s): allopurinol , doxazosin , folic acid, furosemide , gabapentin , lisinopril , methocarbamol , methotrexate , prednisone , cosentyx, and tramadol . His primarily concern today is the Hip Pain (Bilateral  )  Initial Vital Signs:  Pulse/HCG Rate: 73ECG Heart Rate: 75 Temp: 99.3 F (37.4 C) Resp: 16 BP: (!) 167/90 SpO2: 98 %  BMI: Estimated body mass index is 36.25 kg/m as calculated from the following:   Height as of this encounter: 6' 8 (2.032 m).   Weight as of this encounter: 330 lb (149.7 kg).  Risk Assessment: Allergies: Reviewed. He is allergic to amlodipine , gabapentin , and simponi [golimumab].  Allergy Precautions: None required Coagulopathies: Reviewed. None identified.  Blood-thinner therapy: None at this time Active Infection(s): Reviewed. None identified. Mr. Kann is afebrile  Site Confirmation: Mr. Chavira was asked to confirm the procedure and laterality before marking the site Procedure checklist: Completed Consent: Before the procedure and under the influence of no sedative(s), amnesic(s), or anxiolytics, the patient was informed of the treatment options, risks and possible complications. To fulfill our ethical and legal obligations, as recommended by the American Medical Association's Code of Ethics, I have informed the patient of my clinical impression; the nature and purpose of the treatment or procedure; the risks, benefits, and possible complications of the intervention; the alternatives, including doing nothing; the risk(s) and benefit(s) of the alternative treatment(s) or procedure(s); and the risk(s) and benefit(s) of doing nothing. The patient was provided information about the general risks and possible complications associated with the procedure. These may include, but are not limited to: failure to achieve desired goals, infection, bleeding, organ or nerve damage, allergic reactions, paralysis, and death. In addition, the patient was informed of those risks and complications associated to the procedure, such as failure to decrease pain; infection; bleeding; organ or nerve damage with subsequent damage to sensory, motor, and/or autonomic systems, resulting in  permanent pain, numbness, and/or weakness of one or several areas of  the body; allergic reactions; (i.e.: anaphylactic reaction); and/or death. Furthermore, the patient was informed of those risks and complications associated with the medications. These include, but are not limited to: allergic reactions (i.e.: anaphylactic or anaphylactoid reaction(s)); adrenal axis suppression; blood sugar elevation that in diabetics may result in ketoacidosis or comma; water retention that in patients with history of congestive heart failure may result in shortness of breath, pulmonary edema, and decompensation with resultant heart failure; weight gain; swelling or edema; medication-induced neural toxicity; particulate matter embolism and blood vessel occlusion with resultant organ, and/or nervous system infarction; and/or aseptic necrosis of one or more joints. Finally, the patient was informed that Medicine is not an exact science; therefore, there is also the possibility of unforeseen or unpredictable risks and/or possible complications that may result in a catastrophic outcome. The patient indicated having understood very clearly. We have given the patient no guarantees and we have made no promises. Enough time was given to the patient to ask questions, all of which were answered to the patient's satisfaction. Mr. Feagans has indicated that he wanted to continue with the procedure. Attestation: I, the ordering provider, attest that I have discussed with the patient the benefits, risks, side-effects, alternatives, likelihood of achieving goals, and potential problems during recovery for the procedure that I have provided informed consent. Date  Time: 03/06/2024  1:05 PM  Pre-Procedure Preparation:  Monitoring: As per clinic protocol. Respiration, ETCO2, SpO2, BP, heart rate and rhythm monitor placed and checked for adequate function Safety Precautions: Patient was assessed for positional comfort and pressure points  before starting the procedure. Time-out: I initiated and conducted the Time-out before starting the procedure, as per protocol. The patient was asked to participate by confirming the accuracy of the Time Out information. Verification of the correct person, site, and procedure were performed and confirmed by me, the nursing staff, and the patient. Time-out conducted as per Joint Commission's Universal Protocol (UP.01.01.01). Time: 1350 Start Time: 1350 hrs.  Description of Procedure:          Area Prepped: Entire groin area and inferior to anterior superior iliac spine ChloraPrep (2% chlorhexidine  gluconate and 70% isopropyl alcohol) Safety Precautions: Aspiration looking for blood return was conducted prior to all injections. At no point did we inject any substances, as a needle was being advanced. No attempts were made at seeking any paresthesias. Safe injection practices and needle disposal techniques used. Medications properly checked for expiration dates. SDV (single dose vial) medications used.  RIGHT Lateral Femoral Cutaneous Nerve (LFCN) Block: The transducer was moved laterally and superior to the RIGHT anterior superior iliac spine (ASIS). The LFCN was identified as a hyperechoic structure traveling under the fascia lata, medial and inferior to the ASIS. Using in-plane technique, a 22-gauge echogenic needle was advanced toward the LFCN. After negative aspiration, 5 mL of solution containing 4cc of 0.2% Ropivacaine and 1 cc of Decadron  10 mg/cc was injected, visualizing spread around the LFC nerve.    The procedure was repeated for the contralateral side  Vitals:   03/06/24 1316 03/06/24 1350 03/06/24 1356 03/06/24 1401  BP: (!) 167/90 (!) 169/102 (!) 174/96 (!) 161/89  Pulse: 73     Resp: 16 17 11 18   Temp: 99.3 F (37.4 C)     TempSrc: Temporal     SpO2: 98% 98% 98% 98%  Weight: (!) 330 lb (149.7 kg)     Height: 6' 8 (2.032 m)       Start Time: 1350 hrs. End  Time: 1400  hrs.      Materials:  Needle(s) Type: Regular needle Gauge: 22G Length: 1.5-in Medication(s): Please see orders for medications and dosing details.  Imaging Guidance (Non-Spinal):          Type of Imaging Technique: Ultrasound  Antibiotic Prophylaxis:   Anti-infectives (From admission, onward)    None      Indication(s): None identified  Post-operative Assessment:  Post-procedure Vital Signs:  Pulse/HCG Rate: 7393 Temp: 99.3 F (37.4 C) Resp: 18 BP: (!) 161/89 SpO2: 98 %  EBL: None  Complications: No immediate post-treatment complications observed by team, or reported by patient.  Note: The patient tolerated the entire procedure well. A repeat set of vitals were taken after the procedure and the patient was kept under observation following institutional policy, for this type of procedure. Post-procedural neurological assessment was performed, showing return to baseline, prior to discharge. The patient was provided with post-procedure discharge instructions, including a section on how to identify potential problems. Should any problems arise concerning this procedure, the patient was given instructions to immediately contact us , at any time, without hesitation. In any case, we plan to contact the patient by telephone for a follow-up status report regarding this interventional procedure.  Comments:  No additional relevant information.  Plan of Care (POC)  Orders:  No orders of the defined types were placed in this encounter.    Medications ordered for procedure: Meds ordered this encounter  Medications   lidocaine (XYLOCAINE) 2 % (with pres) injection 400 mg   ropivacaine (PF) 2 mg/mL (0.2%) (NAROPIN) injection 18 mL   dexamethasone  (DECADRON ) injection 20 mg   Medications administered: We administered lidocaine, ropivacaine (PF) 2 mg/mL (0.2%), and dexamethasone .  See the medical record for exact dosing, route, and time of administration.    Bilateral  lateral femoral cutaneous nerve block 03/06/2024    Follow-up plan:   Return in about 4 weeks (around 04/03/2024) for PPE, F2F.     Recent Visits Date Type Provider Dept  02/20/24 Office Visit Marcelino Nurse, MD [facility]  Showing recent visits within past 90 days and meeting all other requirements Today's Visits Date Type Provider Dept  03/06/24 Procedure visit Marcelino Nurse, MD [facility]  Showing today's visits and meeting all other requirements Future Appointments Date Type Provider Dept  04/04/24 Appointment Marcelino Nurse, MD [facility]  Showing future appointments within next 90 days and meeting all other requirements   Disposition: Discharge home  Discharge (Date  Time): 03/06/2024; 1410 hrs.   Primary Care Physician: Cleatus Arlyss RAMAN, MD Location: [facility] Note by: Nurse Marcelino, MD (TTS technology used. I apologize for any typographical errors that were not detected and corrected.) Date: 03/06/2024; Time: 2:45 PM  Disclaimer:  Medicine is not an Visual merchandiser. The only guarantee in medicine is that nothing is guaranteed. It is important to note that the decision to proceed with this intervention was based on the information collected from the patient. The Data and conclusions were drawn from the patient's questionnaire, the interview, and the physical examination. Because the information was provided in large part by the patient, it cannot be guaranteed that it has not been purposely or unconsciously manipulated. Every effort has been made to obtain as much relevant data as possible for this evaluation. It is important to note that the conclusions that lead to this procedure are derived in large part from the available data. Always take into account that the treatment will  also be dependent on availability of resources and existing treatment guidelines, considered by other Pain Management Practitioners as being common  knowledge and practice, at the time of the intervention. For Medico-Legal purposes, it is also important to point out that variation in procedural techniques and pharmacological choices are the acceptable norm. The indications, contraindications, technique, and results of the above procedure should only be interpreted and judged by a Board-Certified Interventional Pain Specialist with extensive familiarity and expertise in the same exact procedure and technique.

## 2024-03-06 NOTE — Progress Notes (Signed)
 Safety precautions to be maintained throughout the outpatient stay will include: orient to surroundings, keep bed in low position, maintain call bell within reach at all times, provide assistance with transfer out of bed and ambulation.

## 2024-03-22 ENCOUNTER — Other Ambulatory Visit: Payer: Self-pay | Admitting: Family Medicine

## 2024-03-22 DIAGNOSIS — I10 Essential (primary) hypertension: Secondary | ICD-10-CM

## 2024-03-22 NOTE — Telephone Encounter (Signed)
 E-scribed refill.  Pls schedule CPE and fasting labs for additional refills.

## 2024-04-04 ENCOUNTER — Ambulatory Visit
Attending: Student in an Organized Health Care Education/Training Program | Admitting: Student in an Organized Health Care Education/Training Program

## 2024-04-04 ENCOUNTER — Encounter: Payer: Self-pay | Admitting: Student in an Organized Health Care Education/Training Program

## 2024-04-04 VITALS — BP 165/97 | HR 78 | Temp 97.2°F | Ht >= 80 in | Wt 330.0 lb

## 2024-04-04 DIAGNOSIS — M47816 Spondylosis without myelopathy or radiculopathy, lumbar region: Secondary | ICD-10-CM | POA: Insufficient documentation

## 2024-04-04 DIAGNOSIS — G894 Chronic pain syndrome: Secondary | ICD-10-CM | POA: Insufficient documentation

## 2024-04-04 NOTE — Progress Notes (Signed)
 Safety precautions to be maintained throughout the outpatient stay will include: orient to surroundings, keep bed in low position, maintain call bell within reach at all times, provide assistance with transfer out of bed and ambulation.

## 2024-04-04 NOTE — Progress Notes (Signed)
 PROVIDER NOTE: Interpretation of information contained herein should be left to medically-trained personnel. Specific patient instructions are provided elsewhere under Patient Instructions section of medical record. This document was created in part using AI and STT-dictation technology, any transcriptional errors that may result from this process are unintentional.  Patient: Mike Foster  Service: E/M   PCP: [name], MD  DOB: [birth year]  DOS: 04/04/2024  Provider: Wallie Sherry, MD  MRN: [id number]  Delivery: Face-to-face  Specialty: Interventional Pain Management  Type: Established Patient  Setting: Ambulatory outpatient facility  Specialty designation: 09  Referring Prov.: [name], MD  Location: Outpatient office facility       History of present illness (HPI) Mike Foster, a 59 y.o. year old male, is here today because of his Lumbar spondylosis [M47.816]. [name] primary complain today is Back Pain (lower)  Pertinent problems: [name] does not have any pertinent problems on file.  Pain Assessment: Severity of Chronic pain is reported as a 8 /10. Location: Back Lower/pain radiaties down both leg at times. Onset: More than a month ago. Quality: Discomfort, Numbness, Constant. Timing: Constant. Modifying factor(s): nothing. Vitals:  height is 6' 8 (2.032 m) and weight is 330 lb (149.7 kg) (abnormal). His temperature is 97.2 F (36.2 C) (abnormal). His blood pressure is 165/97 (abnormal) and his pulse is 78. His oxygen saturation is 96%.  BMI: Estimated body mass index is 36.25 kg/m as calculated from the following:   Height as of this encounter: 6' 8 (2.032 m).   Weight as of this encounter: 330 lb (149.7 kg).  Last encounter: 02/20/2024. Last procedure: 03/06/2024.  Reason for encounter: post-procedure evaluation and assessment.   Discussed the use of AI scribe software for clinical note transcription with the patient, who gave verbal consent to  proceed.  History of Present Illness   Mike Foster is a 58 year old male with degenerative disc disease who presents with persistent lower back pain.  He experiences persistent lower back pain. The pain is primarily located in the lower back and occasionally radiates down into his legs, particularly when he remains bent over.  He has a history of receiving epidural injections and transforaminal injections, which were not helpful in alleviating his symptoms. Recently, he underwent a bilateral LFCN injection, which also did not provide relief.  An MRI has shown severe degenerative disc disease with severe disc desiccation. The majority of his pain remains in the lower back, with infrequent radiation to the legs.      HPI from initial clinic visit 02/20/2024  Mike Foster is a 58 year old male who presents for pain management consultation for chronic low back pain. He was referred by neurosurgeons for evaluation and management of his chronic low back pain.   He experiences chronic low back pain primarily located at the L5 region, with pain radiating down both legs. The pain is more prominent in the front of his legs, with occasional 'shock-like' sensations not extending past the knees. He also has numbness in the legs, particularly when standing, and intermittent pain in the groin and buttocks, especially when bending. No pain in the back of the legs.   He has a history of receiving spinal injections, including an L5S1 injection on August 02, 2022, and a transforaminal injection from L3 to L5 on July 7, both of which provided no relief. He continues to see a chiropractor for his condition.   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 He notes difficulty crossing his right leg over his left, experiencing a pulling sensation when attempting to do so.   He is widowed, with his wife having passed away nine years ago,  and has three stepdaughters.   Post-Procedure Evaluation   Procedure:          Anesthesia, Analgesia, Anxiolysis:  Type: Bilateral, lateral femoral cutaneous nerve block Primary Purpose: Diagnostic Region: Groin Region, 1 cm inferior and 1 cm to ASIS Approach: Anterior approach Laterality: Bilateral  Anesthesia: Local (1-2% Lidocaine)  Anxiolysis: None  Sedation: None  Guidance: Ultrasound           Position: Supine   1. Meralgia paraesthetica, right   2. Meralgia paresthetica of left side   3. Right leg pain    NAS-11 Pain score:   Pre-procedure: 7 /10   Post-procedure: 2 /10     Effectiveness:  Initial hour after procedure: 0 %  Subsequent 4-6 hours post-procedure: 0 %  Analgesia past initial 6 hours: 0 %  Ongoing improvement:  Analgesic:  0%   ROS  Constitutional: Denies any fever or chills Gastrointestinal: No reported hemesis, hematochezia, vomiting, or acute GI distress Musculoskeletal: + low back pain Neurological: No reported episodes of acute onset apraxia, aphasia, dysarthria, agnosia, amnesia, paralysis, loss of coordination, or loss of consciousness  Medication Review  Allopurinol , doxazosin , folic acid, furosemide , gabapentin , lisinopril , methocarbamol , methotrexate , predniSONE , secukinumab, and traMADol   History Review  Allergy: [name] is allergic to amlodipine , gabapentin , and simponi [golimumab]. Drug: [name]  reports no history of drug use. Alcohol:  reports current alcohol use. Tobacco:  reports that he has never smoked. He has quit using smokeless tobacco. Social: [name]  reports that he has never smoked. He has quit using smokeless tobacco. He reports current alcohol use. He reports that he does not use drugs. Medical:  has a past medical history of HTN (hypertension), Psoriatic arthritis (HCC), and Seizure (HCC). Surgical: [name]  has a past surgical history that includes Cervical spine surgery and Shoulder surgery (Left,  06/11/2018). Family: family history includes COPD in his father and mother; Heart disease in his mother; Lung cancer in his father; Pancreatic cancer in his maternal aunt.  Laboratory Chemistry Profile   Renal Lab Results  Component Value Date   BUN 14 07/11/2023   CREATININE 1.16 07/11/2023   GFR 69.15 07/11/2023   GFRAA >60 01/28/2020   GFRNONAA 57 (L) 07/04/2023    Hepatic Lab Results  Component Value Date   AST 22 02/23/2023   ALT 35 02/23/2023   ALBUMIN 4.4 02/23/2023   ALKPHOS 71 02/23/2023   HCVAB NEGATIVE 03/16/2015    Electrolytes Lab Results  Component Value Date   NA 139 07/11/2023   K 3.9 07/11/2023   CL 107 07/11/2023   CALCIUM 8.8 07/11/2023    Bone No results found for: VD25OH, VD125OH2TOT, CI6874NY7, CI7874NY7, 25OHVITD1, 25OHVITD2, 25OHVITD3, TESTOFREE, TESTOSTERONE  Inflammation (CRP: Acute Phase) (ESR: Chronic Phase) No results found for: CRP, ESRSEDRATE, LATICACIDVEN       Note: Above Lab results reviewed.  Recent Imaging Review  MR LUMBAR SPINE WO CONTRAST CLINICAL DATA:  Initial evaluation for lower back pain with bilateral leg pain and weakness.  EXAM: MRI LUMBAR SPINE WITHOUT CONTRAST  TECHNIQUE: Multiplanar, multisequence MR imaging of the lumbar spine was performed. No intravenous contrast was administered.  COMPARISON:  MRI from 03/22/2021.  FINDINGS: Segmentation: Standard. Lowest well-formed disc space labeled the L5-S1 level.  Alignment: Straightening of the normal lumbar lordosis. No significant listhesis.  Vertebrae: Vertebral body height maintained without acute or chronic fracture. Decreased T1 signal intensity within the visualized bone marrow, nonspecific, but most commonly related to anemia, smoking, or obesity. Benign hemangioma noted within the L5 vertebral body. No worrisome osseous lesions. No convincing abnormal marrow edema.  Conus medullaris and cauda equina: Conus extends to the  T12-L1 level. Conus and cauda equina appear normal.  Paraspinal and other soft tissues: Unremarkable.  Disc levels:  A degree of underlying congenital spinal stenosis noted.  L1-2: Mild endplate spurring without disc bulge or focal disc herniation. Mild right greater left facet hypertrophy. No spinal stenosis. Foramina remain patent.  L2-3: Disc desiccation with mild diffuse disc bulge. Moderate bilateral facet arthrosis. Underlying short pedicles with resultant mild canal and right lateral recess stenosis. Mild right greater than left L2 foraminal narrowing.  L3-4: Disc desiccation with diffuse disc bulge. Reactive endplate spurring. Superimposed left foraminal to extraforaminal disc protrusion contacts the exiting left L3 nerve root (series 105, image 2). Moderate bilateral facet arthrosis. Resultant moderate multifactorial spinal stenosis, with moderate bilateral L3 foraminal narrowing.  L4-5: Disc bulge with disc desiccation. Mild bilateral facet hypertrophy. Resultant mild to moderate bilateral subarticular stenosis. Central canal remains patent. Mild left greater than right L4 foraminal narrowing.  L5-S1: Disc desiccation. Shallow broad-based right subarticular disc protrusion closely approximates the descending right S1 nerve root without overt impingement (series 105, image 34). Mild right worse than left facet hypertrophy. Epidural lipomatosis. No spinal stenosis. Foramina remain patent.  IMPRESSION: 1. Left foraminal to extraforaminal disc protrusion at L3-4, potentially affecting the exiting left L3 nerve root. 2. Multifactorial degenerative changes at L3-4 with resultant moderate canal and bilateral L3 foraminal stenosis. 3. Shallow right subarticular disc protrusion at L5-S1, closely approximating the descending right S1 nerve root without overt impingement. 4. Disc bulge with facet hypertrophy at L4-5 with resultant mild to moderate bilateral subarticular  stenosis.  Electronically Signed   By: Morene Hoard M.D.   On: 02/11/2024 03:09 Note: Reviewed        Physical Exam  Vitals: BP (!) 165/97   Pulse 78   Temp (!) 97.2 F (36.2 C)   Ht 6' 8 (2.032 m)   Wt (!) 330 lb (149.7 kg)   SpO2 96%   BMI 36.25 kg/m  BMI: Estimated body mass index is 36.25 kg/m as calculated from the following:   Height as of this encounter: 6' 8 (2.032 m).   Weight as of this encounter: 330 lb (149.7 kg). Ideal: Ideal body weight: 96 kg (211 lb 10.3 oz) Adjusted ideal body weight: 117.5 kg (258 lb 15.8 oz) General appearance: Well nourished, well developed, and well hydrated. In no apparent acute distress Mental status: Alert, oriented x 3 (person, place, & time)       Respiratory: No evidence of acute respiratory distress Eyes: PERLA  MR LUMBAR SPINE WO CONTRAST   Narrative CLINICAL DATA:  Initial evaluation for lower back pain with bilateral leg pain and weakness.   EXAM: MRI LUMBAR SPINE WITHOUT CONTRAST   TECHNIQUE: Multiplanar, multisequence MR imaging of the lumbar spine was performed. No intravenous contrast was administered.   COMPARISON:  MRI from 03/22/2021.   FINDINGS: Segmentation: Standard. Lowest well-formed disc space labeled the L5-S1 level.   Alignment: Straightening of the normal lumbar lordosis. No significant listhesis.   Vertebrae: Vertebral body height maintained without acute or chronic fracture. Decreased T1 signal intensity within the visualized bone  marrow, nonspecific, but most commonly related to anemia, smoking, or obesity. Benign hemangioma noted within the L5 vertebral body. No worrisome osseous lesions. No convincing abnormal marrow edema.   Conus medullaris and cauda equina: Conus extends to the T12-L1 level. Conus and cauda equina appear normal.   Paraspinal and other soft tissues: Unremarkable.   Disc levels:   A degree of underlying congenital spinal stenosis noted.   L1-2: Mild  endplate spurring without disc bulge or focal disc herniation. Mild right greater left facet hypertrophy. No spinal stenosis. Foramina remain patent.   L2-3: Disc desiccation with mild diffuse disc bulge. Moderate bilateral facet arthrosis. Underlying short pedicles with resultant mild canal and right lateral recess stenosis. Mild right greater than left L2 foraminal narrowing.   L3-4: Disc desiccation with diffuse disc bulge. Reactive endplate spurring. Superimposed left foraminal to extraforaminal disc protrusion contacts the exiting left L3 nerve root (series 105, image 2). Moderate bilateral facet arthrosis. Resultant moderate multifactorial spinal stenosis, with moderate bilateral L3 foraminal narrowing.   L4-5: Disc bulge with disc desiccation. Mild bilateral facet hypertrophy. Resultant mild to moderate bilateral subarticular stenosis. Central canal remains patent. Mild left greater than right L4 foraminal narrowing.   L5-S1: Disc desiccation. Shallow broad-based right subarticular disc protrusion closely approximates the descending right S1 nerve root without overt impingement (series 105, image 34). Mild right worse than left facet hypertrophy. Epidural lipomatosis. No spinal stenosis. Foramina remain patent.   IMPRESSION: 1. Left foraminal to extraforaminal disc protrusion at L3-4, potentially affecting the exiting left L3 nerve root. 2. Multifactorial degenerative changes at L3-4 with resultant moderate canal and bilateral L3 foraminal stenosis. 3. Shallow right subarticular disc protrusion at L5-S1, closely approximating the descending right S1 nerve root without overt impingement. 4. Disc bulge with facet hypertrophy at L4-5 with resultant mild to moderate bilateral subarticular stenosis.     Electronically Signed By: Morene Hoard M.D. On: 02/11/2024 03:09  Assessment   Diagnosis  1. Lumbar spondylosis   2. Lumbar facet arthropathy   3. Chronic  pain syndrome      Updated Problems: Problem  Lumbar Facet Arthropathy  Chronic Pain Syndrome    Plan of Care  Babatunde Seago has a history of greater than 3 months of moderate to severe pain which is resulted in functional impairment.  The patient has tried various conservative therapeutic options such as NSAIDs, Tylenol , muscle relaxants, physical therapy which was inadequately effective.  Patient's pain is predominantly axial with physical exam and L-MRI findings suggestive of facet arthropathy. Lumbar facet medial branch nerve blocks were discussed with the patient.  Risks and benefits were reviewed.  Patient would like to proceed with bilateral L3, L4, L5 medial branch nerve block.    Mr. Krishan Mcbreen has a current medication list which includes the following long-term medication(s): allopurinol , doxazosin , furosemide , gabapentin , and lisinopril .  Pharmacotherapy (Medications Ordered): No orders of the defined types were placed in this encounter.  Orders:  Orders Placed This Encounter  Procedures   LUMBAR FACET(MEDIAL BRANCH NERVE BLOCK) MBNB    Diagnosis: Lumbar Facet Syndrome (M47.816); Lumbosacral Facet Syndrome (M47.817); Lumbar Facet Joint Pain (M54.59) Medical Necessity Statement: 1.Severe chronic axial low back pain causing functional impairment documented by ongoing pain scale assessments. 2.Pain present for longer than 3 months (Chronic) documented to have failed noninvasive conservative therapies. 3.Absence of untreated radiculopathy. 4.There is no radiological evidence of untreated fractures, tumor, infection, or deformity.  Physical Examination Findings: Positive Kemp Maneuver: (Y)  Positive Lumbar Hyperextension-Rotation provocative test: (  Y)    Standing Status:   Future    Expected Date:   05/27/2024    Expiration Date:   04/04/2025    Scheduling Instructions:     Procedure: Lumbar facet Block     Type: Medial Branch Block     Side: Bilateral     Purpose:  Diagnostic Radiologic Mapping     Level(s): L3-4, L4-5, by Fluoroscopic Mapping Facets (L3, L4, L5, Medial Branch)     Sedation: Patient's choice.     Timeframe: As soon as schedule allows.    Where will this procedure be performed?:   [facility]     Bilateral lateral femoral cutaneous nerve block 03/06/2024   Return in about 8 weeks (around 05/27/2024) for B/L L3,4,5 MBNB, in clinic IV Versed.    Recent Visits Date Type Provider Dept  03/06/24 Procedure visit Marcelino Nurse, MD [facility]  02/20/24 Office Visit Marcelino Nurse, MD [facility]  Showing recent visits within past 90 days and meeting all other requirements Today's Visits Date Type Provider Dept  04/04/24 Office Visit Marcelino Nurse, MD [facility]  Showing today's visits and meeting all other requirements Future Appointments No visits were found meeting these conditions. Showing future appointments within next 90 days and meeting all other requirements  I discussed the assessment and treatment plan with the patient. The patient was provided an opportunity to ask questions and all were answered. The patient agreed with the plan and demonstrated an understanding of the instructions.  Patient advised to call back or seek an in-person evaluation if the symptoms or condition worsens.  I personally spent a total of 20 minutes in the care of the patient today including preparing to see the patient, getting/reviewing separately obtained history, performing a medically appropriate exam/evaluation, counseling and educating, placing orders, and documenting clinical information in the EHR.   Note by: Nurse Marcelino, MD (TTS and AI technology used. I apologize for any typographical errors that were not detected and corrected.) Date: 04/04/2024; Time: 9:33 AM

## 2024-04-08 ENCOUNTER — Other Ambulatory Visit: Payer: Self-pay | Admitting: Family Medicine

## 2024-04-08 DIAGNOSIS — M109 Gout, unspecified: Secondary | ICD-10-CM

## 2024-04-08 DIAGNOSIS — I10 Essential (primary) hypertension: Secondary | ICD-10-CM

## 2024-04-09 ENCOUNTER — Other Ambulatory Visit (INDEPENDENT_AMBULATORY_CARE_PROVIDER_SITE_OTHER)

## 2024-04-09 DIAGNOSIS — I10 Essential (primary) hypertension: Secondary | ICD-10-CM | POA: Diagnosis not present

## 2024-04-09 DIAGNOSIS — M109 Gout, unspecified: Secondary | ICD-10-CM | POA: Diagnosis not present

## 2024-04-10 ENCOUNTER — Ambulatory Visit: Payer: Self-pay | Admitting: Family Medicine

## 2024-04-10 LAB — LIPID PANEL
Cholesterol: 126 mg/dL (ref 0–200)
HDL: 43.8 mg/dL (ref >39.00)
LDL Cholesterol: 66 mg/dL (ref 0–99)
NonHDL: 82.41
Total CHOL/HDL Ratio: 3
Triglycerides: 83 mg/dL (ref 0.0–149.0)
VLDL: 16.6 mg/dL (ref 0.0–40.0)

## 2024-04-10 LAB — CBC WITH DIFFERENTIAL/PLATELET
Basophils Absolute: 0 K/uL (ref 0.0–0.1)
Basophils Relative: 0.6 % (ref 0.0–3.0)
Eosinophils Absolute: 0.1 K/uL (ref 0.0–0.7)
Eosinophils Relative: 1.8 % (ref 0.0–5.0)
HCT: 41.1 % (ref 39.0–52.0)
Hemoglobin: 14.3 g/dL (ref 13.0–17.0)
Lymphocytes Relative: 14.4 % (ref 12.0–46.0)
Lymphs Abs: 1 K/uL (ref 0.7–4.0)
MCHC: 34.9 g/dL (ref 30.0–36.0)
MCV: 97.6 fl (ref 78.0–100.0)
Monocytes Absolute: 0.7 K/uL (ref 0.1–1.0)
Monocytes Relative: 10.3 % (ref 3.0–12.0)
Neutro Abs: 5.1 K/uL (ref 1.4–7.7)
Neutrophils Relative %: 72.9 % (ref 43.0–77.0)
Platelets: 172 K/uL (ref 150.0–400.0)
RBC: 4.21 Mil/uL — ABNORMAL LOW (ref 4.22–5.81)
RDW: 13.4 % (ref 11.5–15.5)
WBC: 7 K/uL (ref 4.0–10.5)

## 2024-04-10 LAB — COMPREHENSIVE METABOLIC PANEL WITH GFR
ALT: 47 U/L (ref 0–53)
AST: 29 U/L (ref 0–37)
Albumin: 4.4 g/dL (ref 3.5–5.2)
Alkaline Phosphatase: 57 U/L (ref 39–117)
BUN: 16 mg/dL (ref 6–23)
CO2: 24 meq/L (ref 19–32)
Calcium: 9 mg/dL (ref 8.4–10.5)
Chloride: 105 meq/L (ref 96–112)
Creatinine, Ser: 1.16 mg/dL (ref 0.40–1.50)
GFR: 68.79 mL/min (ref >60.00)
Glucose, Bld: 94 mg/dL (ref 70–99)
Potassium: 3.9 meq/L (ref 3.5–5.1)
Sodium: 137 meq/L (ref 135–145)
Total Bilirubin: 0.8 mg/dL (ref 0.2–1.2)
Total Protein: 7 g/dL (ref 6.0–8.3)

## 2024-04-10 LAB — URIC ACID: Uric Acid, Serum: 6.5 mg/dL (ref 4.0–7.8)

## 2024-04-15 ENCOUNTER — Emergency Department (HOSPITAL_COMMUNITY)

## 2024-04-15 ENCOUNTER — Encounter (HOSPITAL_COMMUNITY): Payer: Self-pay

## 2024-04-15 ENCOUNTER — Other Ambulatory Visit: Payer: Self-pay

## 2024-04-15 ENCOUNTER — Emergency Department (HOSPITAL_COMMUNITY)
Admission: EM | Admit: 2024-04-15 | Discharge: 2024-04-15 | Disposition: A | Discharge status: Home / Self Care | Attending: Emergency Medicine | Admitting: Emergency Medicine

## 2024-04-15 DIAGNOSIS — I10 Essential (primary) hypertension: Secondary | ICD-10-CM | POA: Insufficient documentation

## 2024-04-15 DIAGNOSIS — R42 Dizziness and giddiness: Secondary | ICD-10-CM | POA: Insufficient documentation

## 2024-04-15 DIAGNOSIS — R072 Precordial pain: Secondary | ICD-10-CM | POA: Insufficient documentation

## 2024-04-15 DIAGNOSIS — R079 Chest pain, unspecified: Secondary | ICD-10-CM

## 2024-04-15 LAB — CBC WITH DIFFERENTIAL/PLATELET
Abs Immature Granulocytes: 0.04 K/uL (ref 0.00–0.07)
Basophils Absolute: 0.1 K/uL (ref 0.0–0.1)
Basophils Relative: 1 %
Eosinophils Absolute: 0 K/uL (ref 0.0–0.5)
Eosinophils Relative: 1 %
HCT: 44.1 % (ref 39.0–52.0)
Hemoglobin: 15.4 g/dL (ref 13.0–17.0)
Immature Granulocytes: 1 %
Lymphocytes Relative: 9 %
Lymphs Abs: 0.7 K/uL (ref 0.7–4.0)
MCH: 33.6 pg (ref 26.0–34.0)
MCHC: 34.9 g/dL (ref 30.0–36.0)
MCV: 96.1 fL (ref 80.0–100.0)
Monocytes Absolute: 0.7 K/uL (ref 0.1–1.0)
Monocytes Relative: 8 %
Neutro Abs: 7 K/uL (ref 1.7–7.7)
Neutrophils Relative %: 80 %
Platelets: 189 K/uL (ref 150–400)
RBC: 4.59 MIL/uL (ref 4.22–5.81)
RDW: 13.2 % (ref 11.5–15.5)
WBC: 8.5 K/uL (ref 4.0–10.5)
nRBC: 0 % (ref 0.0–0.2)

## 2024-04-15 LAB — COMPREHENSIVE METABOLIC PANEL WITH GFR
ALT: 57 U/L — ABNORMAL HIGH (ref 0–44)
AST: 35 U/L (ref 15–41)
Albumin: 3.9 g/dL (ref 3.5–5.0)
Alkaline Phosphatase: 59 U/L (ref 38–126)
Anion gap: 13 (ref 5–15)
BUN: 19 mg/dL (ref 6–20)
CO2: 20 mmol/L — ABNORMAL LOW (ref 22–32)
Calcium: 9.1 mg/dL (ref 8.9–10.3)
Chloride: 105 mmol/L (ref 98–111)
Creatinine, Ser: 1.48 mg/dL — ABNORMAL HIGH (ref 0.61–1.24)
GFR, Estimated: 54 mL/min — ABNORMAL LOW (ref >60)
Glucose, Bld: 145 mg/dL — ABNORMAL HIGH (ref 70–99)
Potassium: 3.8 mmol/L (ref 3.5–5.1)
Sodium: 138 mmol/L (ref 135–145)
Total Bilirubin: 0.6 mg/dL (ref 0.0–1.2)
Total Protein: 6.8 g/dL (ref 6.5–8.1)

## 2024-04-15 LAB — TROPONIN I (HIGH SENSITIVITY)
Troponin I (High Sensitivity): 12 ng/L (ref <18)
Troponin I (High Sensitivity): 16 ng/L (ref <18)

## 2024-04-15 MED ORDER — ASPIRIN 81 MG PO CHEW
324.0000 mg | CHEWABLE_TABLET | Freq: Once | ORAL | Status: AC
  Administered 2024-04-15: 324 mg via ORAL
  Filled 2024-04-15: qty 4

## 2024-04-15 MED ORDER — ACETAMINOPHEN 325 MG PO TABS
975.0000 mg | ORAL_TABLET | Freq: Once | ORAL | Status: AC
  Administered 2024-04-15: 975 mg via ORAL
  Filled 2024-04-15: qty 3

## 2024-04-15 NOTE — Discharge Instructions (Signed)
 You were seen in the emergency room for chest pain Your blood work and EKG all looked okay There is no evidence of heart attack today but is important that you follow-up with a cardiologist They will be calling you because we have placed a referral to make an appointment to be seen in the office Also follow-up with your primary care doctor as scheduled for tomorrow Return to the emergency room for chest pain trouble breathing or any other concerns

## 2024-04-15 NOTE — ED Notes (Signed)
 EKG printed & with pt in paper form, no pt bracelet/stickers printing to be able to export into chart. MSE provider saw the EKG.

## 2024-04-15 NOTE — ED Provider Notes (Signed)
 [place] EMERGENCY DEPARTMENT AT [facility] Provider Note   CSN: [id number] Arrival date & time: 04/15/24  [id number]     Patient presents with: Chest Tightness, Shortness of Breath, and Dizziness   Mike Foster is a 58 y.o. male.  With a history of hypertension and vertigo who presents to the ED for chest pain.  Patient reports that he was walking into work today and shortly after walk across a parking lot began to develop chest pain.  Chest pain localized over the substernal region without radiation to other areas.  Associated shortness of breath and dizziness.  States that the pain was tight quality.  He has been here for several hours and not having active chest pain upon my assessment.  No prior similar episodes of chest pain.  No fevers chills recent illness    Shortness of Breath Dizziness Associated symptoms: shortness of breath        Prior to Admission medications   Medication Sig Start Date End Date Taking? Authorizing Provider  allopurinol  200 MG TABS Take 200 mg by mouth daily. 06/06/23   Solian Arlyss RAMAN, MD  doxazosin  (CARDURA ) 2 MG tablet TAKE 1 TABLET(2 MG) BY MOUTH DAILY 03/22/24   Solian Arlyss RAMAN, MD  folic acid (FOLVITE) 1 MG tablet Take 1 mg by mouth daily.    [provider]  furosemide  (LASIX ) 20 MG tablet TAKE 1 TABLET(20 MG) BY MOUTH DAILY AS NEEDED FOR FLUID RETENTION 01/19/24   Solian Arlyss RAMAN, MD  gabapentin  (NEURONTIN ) 100 MG capsule Take 1 capsule (100 mg total) by mouth 2 (two) times daily. 02/13/24   Ulis Bottcher, PA-C  lisinopril  (ZESTRIL ) 40 MG tablet TAKE 1 TABLET(40 MG) BY MOUTH DAILY 11/06/23   Solian Arlyss RAMAN, MD  methocarbamol  (ROBAXIN ) 500 MG tablet Take 1 tablet (500 mg total) by mouth every 8 (eight) hours as needed for muscle spasms. 01/24/24   Ulis Bottcher, PA-C  methotrexate  (RHEUMATREX) 2.5 MG tablet Take 6 tablets (15 mg total) by mouth once a week. Caution:Chemotherapy. Protect from light. 10/02/20   Solian Arlyss RAMAN, MD   predniSONE  (DELTASONE ) 20 MG tablet TAKE 2 TABLETS BY MOUTH DAILY FOR 5 DAYS THEN 1 TABLET BY MOUTH DAILY FOR 5 DAYS. TAKE WITH FOOD. DO NOT TAKE ALEVE/IBUPROFEN 10/04/23   Solian Arlyss RAMAN, MD  Secukinumab (COSENTYX) 125 MG/5ML SOLN 7 mg. 06/20/23   [provider]  traMADol  (ULTRAM ) 50 MG tablet Take 1 tablet (50 mg total) by mouth every 8 (eight) hours as needed (sedation caution.). 01/05/24   Solian Arlyss RAMAN, MD    Allergies: Amlodipine , Gabapentin , and Simponi [golimumab]    Review of Systems  Respiratory:  Positive for shortness of breath.   Neurological:  Positive for dizziness.    Updated Vital Signs BP (!) 158/97   Pulse 84   Temp 98.3 F (36.8 C) (Oral)   Resp 19   Ht 6' 8 (2.032 m)   Wt (!) 154.2 kg   SpO2 100%   BMI 37.35 kg/m   Physical Exam Vitals and nursing note reviewed.  HENT:     Head: Normocephalic and atraumatic.  Eyes:     Pupils: Pupils are equal, round, and reactive to light.  Cardiovascular:     Rate and Rhythm: Normal rate and regular rhythm.  Pulmonary:     Effort: Pulmonary effort is normal.     Breath sounds: Normal breath sounds.  Abdominal:     Palpations: Abdomen is soft.  Tenderness: There is no abdominal tenderness.  Skin:    General: Skin is warm and dry.  Neurological:     General: No focal deficit present.     Mental Status: He is alert.     Motor: No weakness.  Psychiatric:        Mood and Affect: Mood normal.     (all labs ordered are listed, but only abnormal results are displayed) Labs Reviewed  COMPREHENSIVE METABOLIC PANEL WITH GFR - Abnormal; Notable for the following components:      Result Value   CO2 20 (*)    Glucose, Bld 145 (*)    Creatinine, Ser 1.48 (*)    ALT 57 (*)    GFR, Estimated 54 (*)    All other components within normal limits  CBC WITH DIFFERENTIAL/PLATELET  TROPONIN I (HIGH SENSITIVITY)  TROPONIN I (HIGH SENSITIVITY)    EKG: None  Radiology: DG Chest 2 View Result Date:  04/15/2024 CLINICAL DATA:  Shortness of breath.  Chest tightness. EXAM: CHEST - 2 VIEW COMPARISON:  07/04/2023. FINDINGS: Stable cardiomediastinal contours. No focal consolidation, pleural effusion, or pneumothorax. Lower cervical ACDF. No acute osseous abnormality. IMPRESSION: No acute cardiopulmonary findings. Electronically Signed   By: Harrietta Sherry M.D.   On: 04/15/2024 10:46     Procedures   Medications Ordered in the ED  aspirin chewable tablet 324 mg (324 mg Oral Given 04/15/24 1531)  acetaminophen  (TYLENOL ) tablet 975 mg (975 mg Oral Given 04/15/24 1531)                                    Medical Decision Making 58 year old male with history as above presenting to the ED for chest pain.  Occurred after some mild exertion walking across parking lot this morning.  No active chest pain on my assessment.  Stable on room air slightly hypertensive.  Workup for ACS negative based on EKG findings and troponins.  Creatinine slightly up from baseline with hyperglycemia.  He has a scheduled appointment with his PCP tomorrow.  Will also provide him with cardiology referral given risk factors and chest pain occurring with exertion.  No indication for further testing or admission at this time.  Patient will follow-up as scheduled for tomorrow and understands return precautions to come back for.  Risk OTC drugs.        Final diagnoses:  Dizziness  Chest pain, unspecified type    ED Discharge Orders          Ordered    Ambulatory referral to Cardiology       Comments: If you have not heard from the Cardiology office within the next 72 hours please call [phone].   04/15/24 1631               Pamella Ozell LABOR, DO 04/15/24 1903

## 2024-04-15 NOTE — ED Triage Notes (Signed)
 Pt came in via POV d/t some CP/SOB & some dizziness. Does reports Hx of vertigo & it feeling similar in that context with the dizziness. Chest feels tight. No cardiac Hx, cough or fevers. Does have a posterior HA.

## 2024-04-15 NOTE — ED Provider Triage Note (Signed)
 Emergency Medicine Provider Triage Evaluation Note  Mike Foster , a 58 y.o. male  was evaluated in triage.  Pt complains of chest tightness, shortness of breath and vertigo that started acutely this morning while at work, worse on exertion and with head position.  Noted to have previous history of vertigo.  No reported cardiac history.  Endorses posterior headache.  Denies fever, blurry vision, tinnitus, dysphagia, odynophagia, cough, congestion, abdominal pain, nausea, vomiting, diarrhea, dysuria, rashes, lower leg swelling.  Review of Systems  Positive: N/a Negative: N/a  Physical Exam  BP (!) 153/95   Pulse (!) 119   Temp 97.9 F (36.6 C)   Resp 19   Ht 6' 8 (2.032 m)   Wt (!) 154.2 kg   SpO2 97%   BMI 37.35 kg/m  Gen:   Awake, no distress   Resp:  Normal effort  MSK:   Moves extremities without difficulty  Other:    Medical Decision Making  Medically screening exam initiated at 9:04 AM.  Appropriate orders placed.  Mike Foster was informed that the remainder of the evaluation will be completed by another provider, this initial triage assessment does not replace that evaluation, and the importance of remaining in the ED until their evaluation is complete.     Beola Terrall RAMAN, [place] 04/15/24 [phone]

## 2024-04-15 NOTE — ED Notes (Signed)
 Unable to provide urine, sample cup in hand

## 2024-04-16 ENCOUNTER — Encounter: Payer: Self-pay | Admitting: Family Medicine

## 2024-04-16 ENCOUNTER — Ambulatory Visit (INDEPENDENT_AMBULATORY_CARE_PROVIDER_SITE_OTHER): Admitting: Family Medicine

## 2024-04-16 VITALS — BP 130/78 | HR 76 | Temp 98.3°F | Ht >= 80 in | Wt 334.2 lb

## 2024-04-16 DIAGNOSIS — R0789 Other chest pain: Secondary | ICD-10-CM

## 2024-04-16 DIAGNOSIS — Z1211 Encounter for screening for malignant neoplasm of colon: Secondary | ICD-10-CM

## 2024-04-16 DIAGNOSIS — M545 Low back pain, unspecified: Secondary | ICD-10-CM

## 2024-04-16 DIAGNOSIS — Z7189 Other specified counseling: Secondary | ICD-10-CM

## 2024-04-16 DIAGNOSIS — Z0000 Encounter for general adult medical examination without abnormal findings: Secondary | ICD-10-CM

## 2024-04-16 DIAGNOSIS — M109 Gout, unspecified: Secondary | ICD-10-CM

## 2024-04-16 DIAGNOSIS — I10 Essential (primary) hypertension: Secondary | ICD-10-CM

## 2024-04-16 DIAGNOSIS — L4050 Arthropathic psoriasis, unspecified: Secondary | ICD-10-CM

## 2024-04-16 MED ORDER — DOXAZOSIN MESYLATE 2 MG PO TABS: 2.0000 mg | ORAL_TABLET | Freq: Every day | ORAL | 3 refills | Status: AC

## 2024-04-16 NOTE — Patient Instructions (Addendum)
 Let me know if you can't get set up with cardiology.   Take care.  Glad to see you.  Go to the lab on the way out.   If you have mychart we'll likely use that to update you.     If lightheaded, then cut lisinopril  in half.   Update me as needed.

## 2024-04-16 NOTE — Progress Notes (Unsigned)
 CPE- See plan.  Routine anticipatory guidance given to patient.  See health maintenance.  The possibility exists that previously documented standard health maintenance information may have been brought forward from a previous encounter into this note.  If needed, that same information has been updated to reflect the current situation based on today's encounter.    Tetanus 2019 Flu prev done.  PNA d/w pt.   Shingles d/w pt.   Covid vaccine d/w pt.   D/w patient mz:neupnwd for colon cancer screening, including IFOB vs. colonoscopy.  Risks and benefits of both were discussed and patient voiced understanding.  Pt elects for: IFOB.   Prostate cancer screening and PSA options (with potential risks and benefits of testing vs not testing) were discussed along with recent recs/guidelines.  He declined testing PSA at this point. Diet and exercise d/w pt.   Living will d/w pt.  Brother Salomon designated if patient were incapacitated.  He had back injections pending.   He felt fine yesterday AM.  Ate breakfast w/o sx at the time.  Sx started after getting to work.  Episode of CP d/w pt.  Anterior chest pain.  Was getting out of the truck with sx started.  Sx lasted for hours. Got better at the ER, prior to treatment.  ER eval d/w pt.  He was lightheaded and sweaty when sx started.  No LOC.  He had a friend drive him to ER.  He has cardiology referral pending.    H/o similar pain a few months ago but it didn't last as long.  He wasn't dizzy with that episode.    He doesn't have exertional CP.  Had been able to work/walk w/o CP.  He feels better today.    Per patient he felt like something was lodged in his chest, like it didn't go down right.  Swallowing well o/w.  No LOC.  No FCNAVD.  No bloody or black stools.  No urinary changes.    H/o psoriatic arthritis.  Per outside clinic.    H/o gout.  No recent gout flares.  Still on allopurinol .    He started taking sulfasalazine last week.    PMH and SH  reviewed  Meds, vitals, and allergies reviewed.   ROS: Per HPI.  Unless specifically indicated otherwise in HPI, the patient denies:  General: fever. Eyes: acute vision changes ENT: sore throat Cardiovascular: chest pain Respiratory: SOB GI: vomiting GU: dysuria Musculoskeletal: acute back pain Derm: acute rash Neuro: acute motor dysfunction Psych: worsening mood Endocrine: polydipsia Heme: bleeding Allergy: hayfever  GEN: nad, alert and oriented HEENT: mucous membranes moist NECK: supple w/o LA CV: rrr. PULM: ctab, no inc wob ABD: soft, +bs EXT: no edema SKIN: no acute rash L chest wall is sore to touch but w/o as much pain as yesterday.

## 2024-04-17 NOTE — Assessment & Plan Note (Signed)
 Per outside clinic.

## 2024-04-17 NOTE — Assessment & Plan Note (Signed)
Living will d/w pt.  Brother Danny designated if patient were incapacitated. 

## 2024-04-17 NOTE — Assessment & Plan Note (Signed)
 History of.  Continue allopurinol .

## 2024-04-17 NOTE — Assessment & Plan Note (Addendum)
 Tetanus 2019 Flu d/w pt.  PNA d/w pt.   Shingles d/w pt.   Covid vaccine d/w pt.   D/w patient mz:neupnwd for colon cancer screening, including IFOB vs. colonoscopy.  Risks and benefits of both were discussed and patient voiced understanding.  Pt elects for: IFOB.   Prostate cancer screening and PSA options (with potential risks and benefits of testing vs not testing) were discussed along with recent recs/guidelines.  He declined testing PSA at this point. Diet and exercise d/w pt.   Living will d/w pt.  Brother Salomon designated if patient were incapacitated.

## 2024-04-17 NOTE — Assessment & Plan Note (Addendum)
 Chest wall is sore to the touch and this is reassuring in that it is reproducible.  He does not have exertional chest pain.  Unclear if starting sulfasalazine has any relation to recent symptoms.  I still think it makes sense to see cardiology.  Rationale discussed with patient.  Routine cardiac cautions given to patient in the meantime.  He agrees with plan.

## 2024-04-17 NOTE — Progress Notes (Unsigned)
 Cardiology Office Note:   Date:  04/18/2024  ID:  Mike Foster, DOB 08/04/1965, MRN [id number] PCP:  Solian Arlyss RAMAN, MD  [facility] Providers Cardiologist:  Wendel Haws, MD Referring MD: Solian Arlyss RAMAN, MD  Chief Complaint/Reason for Referral:  Chest pain ASSESSMENT:    1. Precordial pain   2. Primary hypertension   3. Stage 3a chronic kidney disease (HCC)   4. Psoriatic arthritis (HCC)   5. BMI 36.0-36.9,adult     PLAN:   In order of problems listed above: Chest pain and shortness of breath:  We will obtain a coronary CTA and echocardiogram to evaluate further.  If the patient has mild obstructive coronary artery disease, they will require a statin (with goal LDL < 70) and aspirin , if they have high-grade disease we will need to consider optimal medical therapy and if symptoms are refractory to medical therapy, then a cardiac catheterization with possible PCI will be pursued to alleviate symptoms.  If they have high risk disease we will proceed directly to cardiac catheterization.   Hypertension: Continue lisinopril  40 mg. CKD stage IIIa: Continue lisinopril  40 mg; consider SGLT2 inhibitor in the future. Psoriatic arthritis: Continue Cosentyx 7 mg q. monthly  Elevated BMI: Diet and exercise modification            Dispo:  Return in about 6 months (around 10/16/2024).       I spent 42 minutes reviewing all clinical data during and prior to this visit including all relevant imaging studies, laboratories, clinical information from other health systems and prior notes from both Cardiology and other specialties, interviewing the patient, conducting a complete physical examination, and coordinating care in order to formulate a comprehensive and personalized evaluation and treatment plan.   History of Present Illness:    FOCUSED PROBLEM LIST:   Hypertension Intolerant of amlodipine  Vertigo Psoriatic arthritis On Cosentyx CKD stage IIIA LAFB BMI 36  November 2025:   Patient consents to use of AI scribe. The patient is a 58 year old male with above listed medical problems here for emergency room follow-up.  He presented to emergency department a few days ago with chest pain and shortness of breath.  His blood pressure was mildly elevated.  His troponins were reassuring.  His other laboratories were consistent with CKD stage IIIA.  His EKG demonstrated normal sinus rhythm with left anterior fascicular block.  He was eventually discharged and is here for follow-up.  He experienced an episode of shortness of breath and chest discomfort while at work, which led to an emergency department visit. Initially asymptomatic, he developed chest tightness after walking across a parking lot and climbing steps. The symptoms worsened, resulting in dizziness, cold sweats, and numbness and tingling throughout his body. Coworkers noted a change in his color to purple. He managed to reach the front office, where he was promptly taken to the emergency department. An EKG and blood tests conducted there did not indicate a heart attack, and he was subsequently discharged. Since the incident, he has not experienced similar symptoms.  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 He denies experiencing chest pain during these activities. However, he notes that his breathing can be more labored on some days compared to others. He also reported an episode of lightheadedness and spinning sensations on the Monday of the incident but denies any bleeding, bloody stools, or swelling in his legs.  He has a history of psoriatic arthritis, which sometimes flares up but  is generally well-controlled with Cosentyx. He is also on a blood pressure medication. He was informed that his kidneys are somewhat weak. He does not smoke and has a history of chewing tobacco, which he quit a long time ago. He does not regularly check his blood pressure at home.      Current Medications: Current Meds  Medication Sig   allopurinol  200 MG TABS Take 200 mg by mouth daily.   doxazosin  (CARDURA ) 2 MG tablet Take 1 tablet (2 mg total) by mouth daily.   folic acid (FOLVITE) 1 MG tablet Take 1 mg by mouth daily.   furosemide  (LASIX ) 20 MG tablet TAKE 1 TABLET(20 MG) BY MOUTH DAILY AS NEEDED FOR FLUID RETENTION   gabapentin  (NEURONTIN ) 100 MG capsule Take 1 capsule (100 mg total) by mouth 2 (two) times daily. (Patient taking differently: Take 100 mg by mouth as needed.)   lisinopril  (ZESTRIL ) 40 MG tablet TAKE 1 TABLET(40 MG) BY MOUTH DAILY   methocarbamol  (ROBAXIN ) 500 MG tablet Take 1 tablet (500 mg total) by mouth every 8 (eight) hours as needed for muscle spasms.   methotrexate  (RHEUMATREX) 2.5 MG tablet Take 6 tablets (15 mg total) by mouth once a week. Caution:Chemotherapy. Protect from light.   Secukinumab (COSENTYX) 125 MG/5ML SOLN 7 mg.   sulfaSALAzine (AZULFIDINE) 500 MG tablet Take 500 mg by mouth 2 (two) times daily.   traMADol  (ULTRAM ) 50 MG tablet Take 1 tablet (50 mg total) by mouth every 8 (eight) hours as needed (sedation caution.).   [DISCONTINUED] metoprolol  tartrate (LOPRESSOR ) 100 MG tablet Take 1 tablet (100 mg total) by mouth once for 1 dose. Take 90-120 minutes prior to scan. Hold for SBP less than 110.     Review of Systems:   Please see the history of present illness.    All other systems reviewed and are negative.     EKGs/Labs/Other Test Reviewed:   EKG: 2025 sinus rhythm left anterior fascicular block  EKG Interpretation Date/Time:    Ventricular Rate:    PR Interval:    QRS Duration:    QT Interval:    QTC Calculation:   R Axis:      Text Interpretation:          CARDIAC STUDIES: Refer to CV Procedures and Imaging Tabs   Risk Assessment/Calculations:          Physical Exam:   VS:  BP 136/80 (BP Location: Left Arm, Patient Position: Sitting, Cuff Size: Large)   Pulse 90   Ht 6' 8 (2.032 m)   Wt (!) 341 lb  12.8 oz (155 kg)   SpO2 92%   BMI 37.55 kg/m        Wt Readings from Last 3 Encounters:  04/18/24 (!) 341 lb 12.8 oz (155 kg)  04/16/24 (!) 334 lb 3.2 oz (151.6 kg)  04/15/24 (!) 340 lb (154.2 kg)      GENERAL:  No apparent distress, AOx3 HEENT:  No carotid bruits, +2 carotid impulses, no scleral icterus CAR: RRR no murmurs, gallops, rubs, or thrills RES:  Clear to auscultation bilaterally ABD:  Soft, nontender, nondistended, positive bowel sounds x 4 VASC:  +2 radial pulses, +2 carotid pulses NEURO:  CN 2-12 grossly intact; motor and sensory grossly intact PSYCH:  No active depression or anxiety EXT:  No edema, ecchymosis, or cyanosis  Signed, Ronav Furney K Marcella Charlson, MD  04/18/2024 2:38 PM    [facility] [address] [place], [place], [place]  [address] Phone: [phone]; Fax: [phone]   Note:  This document was prepared using Dragon voice recognition software and may include unintentional dictation errors.

## 2024-04-17 NOTE — Assessment & Plan Note (Signed)
 With follow-up pending at spine clinic.

## 2024-04-18 ENCOUNTER — Other Ambulatory Visit (INDEPENDENT_AMBULATORY_CARE_PROVIDER_SITE_OTHER)

## 2024-04-18 ENCOUNTER — Ambulatory Visit: Attending: Internal Medicine | Admitting: Internal Medicine

## 2024-04-18 ENCOUNTER — Other Ambulatory Visit (HOSPITAL_COMMUNITY): Payer: Self-pay

## 2024-04-18 ENCOUNTER — Encounter: Payer: Self-pay | Admitting: Internal Medicine

## 2024-04-18 VITALS — BP 136/80 | HR 90 | Ht >= 80 in | Wt 341.8 lb

## 2024-04-18 DIAGNOSIS — N1831 Chronic kidney disease, stage 3a: Secondary | ICD-10-CM

## 2024-04-18 DIAGNOSIS — L4050 Arthropathic psoriasis, unspecified: Secondary | ICD-10-CM | POA: Diagnosis not present

## 2024-04-18 DIAGNOSIS — I10 Essential (primary) hypertension: Secondary | ICD-10-CM | POA: Diagnosis not present

## 2024-04-18 DIAGNOSIS — Z6836 Body mass index (BMI) 36.0-36.9, adult: Secondary | ICD-10-CM

## 2024-04-18 DIAGNOSIS — R072 Precordial pain: Secondary | ICD-10-CM

## 2024-04-18 DIAGNOSIS — Z1211 Encounter for screening for malignant neoplasm of colon: Secondary | ICD-10-CM | POA: Diagnosis not present

## 2024-04-18 MED ORDER — METOPROLOL TARTRATE 100 MG PO TABS: 100.0000 mg | ORAL_TABLET | Freq: Once | ORAL | 0 refills | Status: DC

## 2024-04-18 MED ORDER — METOPROLOL TARTRATE 100 MG PO TABS
100.0000 mg | ORAL_TABLET | Freq: Once | ORAL | 0 refills | Status: AC
  Filled 2024-04-18: qty 1, 1d supply, fill #0

## 2024-04-18 NOTE — Patient Instructions (Addendum)
 Medication Instructions:  ONCE Metoprolol  Tartrate 100 mg 2 hours prior to your coronary CTA scan.  *If you need a refill on your cardiac medications before your next appointment, please call your pharmacy*  Lab Work: None ordered today. If you have labs (blood work) drawn today and your tests are completely normal, you will receive your results only by: MyChart Message (if you have MyChart) OR A paper copy in the mail If you have any lab test that is abnormal or we need to change your treatment, we will call you to review the results.  Testing/Procedures: Your provider has requested that you have a coronary CTA scan. Non-Cardiac CT Angiography (CTA), is a special type of CT scan that uses a computer to produce multi-dimensional views of major blood vessels throughout the body. In CT angiography, a contrast material is injected through an IV to help visualize the blood vessels.  Your physician has requested that you have an echocardiogram. Echocardiography is a painless test that uses sound waves to create images of your heart. It provides your doctor with information about the size and shape of your heart and how well your heart's chambers and valves are working. This procedure takes approximately one hour. There are no restrictions for this procedure. Please do NOT wear cologne, perfume, aftershave, or lotions (deodorant is allowed). Please arrive 15 minutes prior to your appointment time.  Please note: We ask at that you not bring children with you during ultrasound (echo/ vascular) testing. Due to room size and safety concerns, children are not allowed in the ultrasound rooms during exams. Our front office staff cannot provide observation of children in our lobby area while testing is being conducted. An adult accompanying a patient to their appointment will only be allowed in the ultrasound room at the discretion of the ultrasound technician under special circumstances. We apologize for any  inconvenience.   Follow-Up: At [facility], you and your health needs are our priority.  As part of our continuing mission to provide you with exceptional heart care, our providers are all part of one team.  This team includes your primary Cardiologist (physician) and Advanced Practice Providers or APPs (Physician Assistants and Nurse Practitioners) who all work together to provide you with the care you need, when you need it.  Your next appointment:   6 month(s)  Provider:   Lurena Red, MD    We recommend signing up for the patient portal called MyChart.  Sign up information is provided on this After Visit Summary.  MyChart is used to connect with patients for Virtual Visits (Telemedicine).  Patients are able to view lab/test results, encounter notes, upcoming appointments, etc.  Non-urgent messages can be sent to your provider as well.   To learn more about what you can do with MyChart, go to [email or web address].   Other Instructions   Your cardiac CT will be scheduled at one of the below locations:   [facility] [address] [place], [place] [address] [phone]   Please follow these instructions carefully (unless otherwise directed):  An IV will be required for this test and Nitroglycerin will be given.  Hold all erectile dysfunction medications at least 3 days (72 hrs) prior to test. (Ie viagra, cialis, sildenafil, tadalafil, etc)   On the Night Before the Test: Be sure to Drink plenty of water. Do not consume any caffeinated/decaffeinated beverages or chocolate 12 hours prior to your test. Do not take any antihistamines 12 hours prior to your test.  On the Day of the Test: Drink plenty of water until 1 hour prior to the test. Do not eat any food 1 hour prior to test. You may take your regular medications prior to the test.  Take metoprolol (Lopressor) two hours prior to test. If you take Furosemide /Hydrochlorothiazide/Spironolactone,  please HOLD on the morning of the test.      After the Test: Drink plenty of water. After receiving IV contrast, you may experience a mild flushed feeling. This is normal. On occasion, you may experience a mild rash up to 24 hours after the test. This is not dangerous. If this occurs, you can take Benadryl  25 mg and increase your fluid intake. If you experience trouble breathing, this can be serious. If it is severe call 911 IMMEDIATELY. If it is mild, please call our office.  We will call to schedule your test 2-4 weeks out understanding that some insurance companies will need an authorization prior to the service being performed.   For more information and frequently asked questions, please visit our website : [email or web address]  For non-scheduling related questions, please contact the cardiac imaging nurse navigator should you have any questions/concerns: Cardiac Imaging Nurse Navigators Direct Office Dial: [phone]   For scheduling needs, including cancellations and rescheduling, please call Brittany, [phone].

## 2024-04-19 LAB — FECAL OCCULT BLOOD, IMMUNOCHEMICAL: Fecal Occult Bld: NEGATIVE

## 2024-04-21 ENCOUNTER — Ambulatory Visit: Payer: Self-pay | Admitting: Family Medicine

## 2024-05-03 ENCOUNTER — Encounter (HOSPITAL_COMMUNITY): Payer: Self-pay

## 2024-05-07 ENCOUNTER — Ambulatory Visit (HOSPITAL_COMMUNITY)
Admission: RE | Admit: 2024-05-07 | Discharge: 2024-05-07 | Disposition: A | Source: Ambulatory Visit | Attending: Internal Medicine

## 2024-05-07 ENCOUNTER — Other Ambulatory Visit: Payer: Self-pay | Admitting: Cardiology

## 2024-05-07 ENCOUNTER — Ambulatory Visit: Payer: Self-pay | Admitting: Internal Medicine

## 2024-05-07 ENCOUNTER — Ambulatory Visit (HOSPITAL_COMMUNITY)
Admission: RE | Admit: 2024-05-07 | Discharge: 2024-05-07 | Disposition: A | Source: Ambulatory Visit | Attending: Cardiology

## 2024-05-07 DIAGNOSIS — R072 Precordial pain: Secondary | ICD-10-CM

## 2024-05-07 DIAGNOSIS — R0602 Shortness of breath: Secondary | ICD-10-CM

## 2024-05-07 DIAGNOSIS — R931 Abnormal findings on diagnostic imaging of heart and coronary circulation: Secondary | ICD-10-CM

## 2024-05-07 MED ORDER — IOHEXOL 350 MG/ML SOLN
100.0000 mL | Freq: Once | INTRAVENOUS | Status: AC | PRN
  Administered 2024-05-07: 100 mL via INTRAVENOUS

## 2024-05-07 MED ORDER — NITROGLYCERIN 0.4 MG SL SUBL
0.8000 mg | SUBLINGUAL_TABLET | Freq: Once | SUBLINGUAL | Status: AC
  Administered 2024-05-07: 0.8 mg via SUBLINGUAL

## 2024-05-07 MED ORDER — METOPROLOL TARTRATE 5 MG/5ML IV SOLN: 10.0000 mg | Freq: Once | INTRAVENOUS | Status: DC | PRN

## 2024-05-07 MED ORDER — DILTIAZEM HCL 25 MG/5ML IV SOLN
10.0000 mg | INTRAVENOUS | Status: DC | PRN
  Administered 2024-05-07: 10 mg via INTRAVENOUS

## 2024-05-08 MED ORDER — ATORVASTATIN CALCIUM 20 MG PO TABS: 20.0000 mg | ORAL_TABLET | Freq: Every day | ORAL | 3 refills | Status: AC

## 2024-05-08 MED ORDER — ASPIRIN 81 MG PO TBEC: 81.0000 mg | DELAYED_RELEASE_TABLET | Freq: Every day | ORAL | Status: AC

## 2024-05-21 ENCOUNTER — Ambulatory Visit (HOSPITAL_COMMUNITY)
Admission: RE | Admit: 2024-05-21 | Discharge: 2024-05-21 | Disposition: A | Discharge status: Home / Self Care | Source: Ambulatory Visit | Attending: Internal Medicine | Admitting: Internal Medicine

## 2024-05-21 DIAGNOSIS — R072 Precordial pain: Secondary | ICD-10-CM | POA: Insufficient documentation

## 2024-05-21 DIAGNOSIS — R0602 Shortness of breath: Secondary | ICD-10-CM | POA: Diagnosis not present

## 2024-05-21 DIAGNOSIS — R600 Localized edema: Secondary | ICD-10-CM | POA: Diagnosis not present

## 2024-05-21 DIAGNOSIS — I10 Essential (primary) hypertension: Secondary | ICD-10-CM | POA: Insufficient documentation

## 2024-05-21 LAB — ECHOCARDIOGRAM COMPLETE
AR max vel: 3.41 cm2
AV Area VTI: 3.45 cm2
AV Area mean vel: 3.23 cm2
AV Mean grad: 4 mmHg
AV Peak grad: 7.8 mmHg
Ao pk vel: 1.4 m/s
Area-P 1/2: 3.6 cm2
S' Lateral: 3.96 cm

## 2024-05-21 MED ORDER — PERFLUTREN LIPID MICROSPHERE
1.0000 mL | INTRAVENOUS | Status: AC | PRN
  Administered 2024-05-21: 5 mL via INTRAVENOUS

## 2024-05-27 ENCOUNTER — Ambulatory Visit (HOSPITAL_BASED_OUTPATIENT_CLINIC_OR_DEPARTMENT_OTHER): Admitting: Student in an Organized Health Care Education/Training Program

## 2024-05-27 ENCOUNTER — Ambulatory Visit
Admission: RE | Admit: 2024-05-27 | Discharge: 2024-05-27 | Disposition: A | Discharge status: Home / Self Care | Source: Ambulatory Visit | Attending: Student in an Organized Health Care Education/Training Program | Admitting: Student in an Organized Health Care Education/Training Program

## 2024-05-27 ENCOUNTER — Encounter: Payer: Self-pay | Admitting: Student in an Organized Health Care Education/Training Program

## 2024-05-27 DIAGNOSIS — M47816 Spondylosis without myelopathy or radiculopathy, lumbar region: Secondary | ICD-10-CM | POA: Insufficient documentation

## 2024-05-27 DIAGNOSIS — M5459 Other low back pain: Secondary | ICD-10-CM | POA: Diagnosis present

## 2024-05-27 MED ORDER — FUROSEMIDE 20 MG PO TABS: 20.0000 mg | ORAL_TABLET | Freq: Every day | ORAL | 3 refills | Status: AC

## 2024-05-27 MED ORDER — ROPIVACAINE HCL 2 MG/ML IJ SOLN
INTRAMUSCULAR | Status: AC
  Filled 2024-05-27: qty 20

## 2024-05-27 MED ORDER — MIDAZOLAM HCL (PF) 2 MG/2ML IJ SOLN
0.5000 mg | Freq: Once | INTRAMUSCULAR | Status: AC
  Administered 2024-05-27: 2 mg via INTRAVENOUS
  Filled 2024-05-27: qty 2

## 2024-05-27 MED ORDER — LIDOCAINE HCL 2 % IJ SOLN
20.0000 mL | Freq: Once | INTRAMUSCULAR | Status: AC
  Administered 2024-05-27: 400 mg

## 2024-05-27 MED ORDER — DEXAMETHASONE SOD PHOSPHATE PF 10 MG/ML IJ SOLN
20.0000 mg | Freq: Once | INTRAMUSCULAR | Status: AC
  Administered 2024-05-27: 20 mg

## 2024-05-27 MED ORDER — LACTATED RINGERS IV SOLN: Freq: Once | INTRAVENOUS | Status: AC

## 2024-05-27 MED ORDER — LIDOCAINE HCL 2 % IJ SOLN
INTRAMUSCULAR | Status: AC
  Filled 2024-05-27: qty 20

## 2024-05-27 MED ORDER — ROPIVACAINE HCL 2 MG/ML IJ SOLN
18.0000 mL | Freq: Once | INTRAMUSCULAR | Status: AC
  Administered 2024-05-27: 18 mL via PERINEURAL

## 2024-05-27 NOTE — Progress Notes (Signed)
 PROVIDER NOTE: Interpretation of information contained herein should be left to medically-trained personnel. Specific patient instructions are provided elsewhere under Patient Instructions section of medical record. This document was created in part using STT-dictation technology, any transcriptional errors that may result from this process are unintentional.  Patient: Mike Foster Type: Established DOB: May 28, 1966 MRN: [id number] PCP: Solian Arlyss RAMAN, MD  Service: Procedure DOS: 05/27/2024 Setting: Ambulatory Location: Ambulatory outpatient facility Delivery: Face-to-face Provider: Wallie Sherry, MD Specialty: Interventional Pain Management Specialty designation: 09 Location: Outpatient facility Ref. Prov.: Sherry Wallie, MD       Interventional Therapy   Type: Lumbar Facet, Medial Branch Block(s) (w/ fluoroscopic mapping) #1  Laterality: Bilateral  Level: L3, L4, and L5 Medial Branch/Dorsal Rami Level(s). Injecting these levels blocks the L3-4 and L4-5 lumbar facet joints. Imaging: Fluoroscopic guidance Spinal ([id number]) Anesthesia: Local anesthesia (1-2% Lidocaine ) Sedation: Minimal Sedation                       DOS: 05/27/2024 Performed by: Wallie Sherry, MD  Primary Purpose: Diagnostic/Therapeutic Indications: Low back pain severe enough to impact quality of life or function. 1. Lumbar spondylosis   2. Lumbar facet arthropathy    NAS-11 Pain score:   Pre-procedure: 8 /10   Post-procedure: 7 /10     Position / Prep / Materials:  Position: Prone  Prep solution: ChloraPrep (2% chlorhexidine  gluconate and 70% isopropyl alcohol) Area Prepped: Posterolateral Lumbosacral Spine (Wide prep: From the lower border of the scapula down to the end of the tailbone and from flank to flank.)  Materials:  Tray: Block Needle(s):  Type: Spinal  Gauge (G): 22  Length: 5-in Qty: 3     H&P (Pre-op Assessment):  Mike Foster is a 58 y.o. (year old), male patient, seen today for  interventional treatment. He  has a past surgical history that includes Cervical spine surgery and Shoulder surgery (Left, 06/11/2018). Mike Foster has a current medication list which includes the following prescription(s): allopurinol , aspirin  ec, atorvastatin , doxazosin , folic acid, furosemide , lisinopril , methocarbamol , methotrexate , cosentyx, sulfasalazine, tramadol , gabapentin , metoprolol  tartrate, and prednisone , and the following Facility-Administered Medications: lactated ringers . His primarily concern today is the Back Pain (low)  Initial Vital Signs:  Pulse/HCG Rate: 83ECG Heart Rate: 89 Temp: (!) 97.2 F (36.2 C) Resp: 18 BP: (!) 151/99 SpO2: 97 %  BMI: Estimated body mass index is 35.15 kg/m as calculated from the following:   Height as of this encounter: 6' 8 (2.032 m).   Weight as of this encounter: 320 lb (145.2 kg).  Risk Assessment: Allergies: Reviewed. He is allergic to amlodipine , gabapentin , and simponi [golimumab].  Allergy Precautions: None required Coagulopathies: Reviewed. None identified.  Blood-thinner therapy: None at this time Active Infection(s): Reviewed. None identified. Mike Foster is afebrile  Site Confirmation: Mike Foster was asked to confirm the procedure and laterality before marking the site Procedure checklist: Completed Consent: Before the procedure and under the influence of no sedative(s), amnesic(s), or anxiolytics, the patient was informed of the treatment options, risks and possible complications. To fulfill our ethical and legal obligations, as recommended by the American Medical Association's Code of Ethics, I have informed the patient of my clinical impression; the nature and purpose of the treatment or procedure; the risks, benefits, and possible complications of the intervention; the alternatives, including doing nothing; the risk(s) and benefit(s) of the alternative treatment(s) or procedure(s); and the risk(s) and benefit(s) of doing  nothing. The patient was provided information about the general risks and  possible complications associated with the procedure. These may include, but are not limited to: failure to achieve desired goals, infection, bleeding, organ or nerve damage, allergic reactions, paralysis, and death. In addition, the patient was informed of those risks and complications associated to Spine-related procedures, such as failure to decrease pain; infection (i.e.: Meningitis, epidural or intraspinal abscess); bleeding (i.e.: epidural hematoma, subarachnoid hemorrhage, or any other type of intraspinal or peri-dural bleeding); organ or nerve damage (i.e.: Any type of peripheral nerve, nerve root, or spinal cord injury) with subsequent damage to sensory, motor, and/or autonomic systems, resulting in permanent pain, numbness, and/or weakness of one or several areas of the body; allergic reactions; (i.e.: anaphylactic reaction); and/or death. Furthermore, the patient was informed of those risks and complications associated with the medications. These include, but are not limited to: allergic reactions (i.e.: anaphylactic or anaphylactoid reaction(s)); adrenal axis suppression; blood sugar elevation that in diabetics may result in ketoacidosis or comma; water retention that in patients with history of congestive heart failure may result in shortness of breath, pulmonary edema, and decompensation with resultant heart failure; weight gain; swelling or edema; medication-induced neural toxicity; particulate matter embolism and blood vessel occlusion with resultant organ, and/or nervous system infarction; and/or aseptic necrosis of one or more joints. Finally, the patient was informed that Medicine is not an exact science; therefore, there is also the possibility of unforeseen or unpredictable risks and/or possible complications that may result in a catastrophic outcome. The patient indicated having understood very clearly. We have given  the patient no guarantees and we have made no promises. Enough time was given to the patient to ask questions, all of which were answered to the patient's satisfaction. Mike Foster has indicated that he wanted to continue with the procedure. Attestation: I, the ordering provider, attest that I have discussed with the patient the benefits, risks, side-effects, alternatives, likelihood of achieving goals, and potential problems during recovery for the procedure that I have provided informed consent. Date  Time: 05/27/2024 10:20 AM  Pre-Procedure Preparation:  Monitoring: As per clinic protocol. Respiration, ETCO2, SpO2, BP, heart rate and rhythm monitor placed and checked for adequate function Safety Precautions: Patient was assessed for positional comfort and pressure points before starting the procedure. Time-out: I initiated and conducted the Time-out before starting the procedure, as per protocol. The patient was asked to participate by confirming the accuracy of the Time Out information. Verification of the correct person, site, and procedure were performed and confirmed by me, the nursing staff, and the patient. Time-out conducted as per Joint Commission's Universal Protocol (UP.01.01.01). Time: 1054 Start Time: 1054 hrs.  Description of Procedure:          Laterality: (see above) Targeted Levels: (see above)  Safety Precautions: Aspiration looking for blood return was conducted prior to all injections. At no point did we inject any substances, as a needle was being advanced. Before injecting, the patient was told to immediately notify me if he was experiencing any new onset of ringing in the ears, or metallic taste in the mouth. No attempts were made at seeking any paresthesias. Safe injection practices and needle disposal techniques used. Medications properly checked for expiration dates. SDV (single dose vial) medications used. After the completion of the procedure, all disposable  equipment used was discarded in the proper designated medical waste containers. Local Anesthesia: Protocol guidelines were followed. The patient was positioned over the fluoroscopy table. The area was prepped in the usual manner. The time-out was completed. The target  area was identified using fluoroscopy. A 12-in long, straight, sterile hemostat was used with fluoroscopic guidance to locate the targets for each level blocked. Once located, the skin was marked with an approved surgical skin marker. Once all sites were marked, the skin (epidermis, dermis, and hypodermis), as well as deeper tissues (fat, connective tissue and muscle) were infiltrated with a small amount of a short-acting local anesthetic, loaded on a 10cc syringe with a 25G, 1.5-in  Needle. An appropriate amount of time was allowed for local anesthetics to take effect before proceeding to the next step. Local Anesthetic: Lidocaine  2.0% The unused portion of the local anesthetic was discarded in the proper designated containers. Technical description of process:  Medial Branch  Dorsal Rami Nerve Block (MBB):  Neuroanatomy note: Each lumbar facet joint receives dual innervation from medial branches arising from the posterior primary rami at the same level and one level above. The target for each lumbar medial branch is the junction of the ipsilateral superior articular and transverse process of the lower vertebral body. (i.e.: The L4-L5 facet joint is innervated by the L4 medial branch [located at L5] and the L3 medial branch [located at L4]. Blocking the L4 Medial Branch is therefore achieved by injecting at the junction of the ipsilateral superior articular and transverse process of the lower vertebral body [L5].).  Exception: The exception to the above rule is the L5-S1 facet joint which has triple innervation requiring the L4 medial branch, as well as the L5 and the S1 Dorsal Rami(s) to be blocked to fully denervate the joint.  Under  fluoroscopic guidance, a needle was inserted until contact was made with os over the target area. After negative aspiration, 2mL of the nerve block solution was injected without difficulty or complication. Paresthesia were avoided during injection. The needle(s) were removed intact and without complication.  Once the entire procedure was completed, the treated area was cleaned, making sure to leave some of the prepping solution back to take advantage of its long term bactericidal properties.         Illustration of the posterior view of the lumbar spine and the posterior neural structures. Laminae of L2 through S1 are labeled. DPRL5, dorsal primary ramus of L5; DPRS1, dorsal primary ramus of S1; DPR3, dorsal primary ramus of L3; FJ, facet (zygapophyseal) joint L3-L4; I, inferior articular process of L4; LB1, lateral branch of dorsal primary ramus of L1; IAB, inferior articular branches from L3 medial branch (supplies L4-L5 facet joint); IBP, intermediate branch plexus; MB3, medial branch of dorsal primary ramus of L3; NR3, third lumbar nerve root; S, superior articular process of L5; SAB, superior articular branches from L4 (supplies L4-5 facet joint also); TP3, transverse process of L3.   Facet Joint Innervation (* possible contribution)  L1-2 T12, L1 (L2*)  Medial Branch  L2-3 L1, L2 (L3*)                     L3-4 L2, L3 (L4*)                     L4-5 L3, L4 (L5*)                     L5-S1 L4, L5, S1                        Vitals:   05/27/24 1046 05/27/24 1052 05/27/24 1056 05/27/24 1105  BP: (!) 189/106 (!) 168/108 ROLLEN)  168/113 (!) 163/98  Pulse:      Resp: 16 17 19 16   Temp:      TempSrc:      SpO2: 100% 98% 99% 99%  Weight:      Height:         End Time: 1100 hrs.  Imaging Guidance (Spinal):         Type of Imaging Technique: Fluoroscopy Guidance (Spinal) Indication(s): Fluoroscopy guidance for needle placement to enhance accuracy in procedures requiring precise needle  localization for targeted delivery of medication in or near specific anatomical locations not easily accessible without such real-time imaging assistance. Exposure Time: Please see nurses notes. Contrast: None used. Fluoroscopic Guidance: I was personally present during the use of fluoroscopy. Tunnel Vision Technique used to obtain the best possible view of the target area. Parallax error corrected before commencing the procedure. Direction-depth-direction technique used to introduce the needle under continuous pulsed fluoroscopy. Once target was reached, antero-posterior, oblique, and lateral fluoroscopic projection used confirm needle placement in all planes. Images permanently stored in EMR. Interpretation: No contrast injected. I personally interpreted the imaging intraoperatively. Adequate needle placement confirmed in multiple planes. Permanent images saved into the patient's record.  Post-operative Assessment:  Post-procedure Vital Signs:  Pulse/HCG Rate: 8389 Temp: (!) 97.2 F (36.2 C) Resp: 16 BP: (!) 163/98 SpO2: 99 %  EBL: None  Complications: No immediate post-treatment complications observed by team, or reported by patient.  Note: The patient tolerated the entire procedure well. A repeat set of vitals were taken after the procedure and the patient was kept under observation following institutional policy, for this type of procedure. Post-procedural neurological assessment was performed, showing return to baseline, prior to discharge. The patient was provided with post-procedure discharge instructions, including a section on how to identify potential problems. Should any problems arise concerning this procedure, the patient was given instructions to immediately contact us , at any time, without hesitation. In any case, we plan to contact the patient by telephone for a follow-up status report regarding this interventional procedure.  Comments:  No additional relevant  information.  Plan of Care (POC)  Orders:  Orders Placed This Encounter  Procedures   DG PAIN CLINIC C-ARM 1-60 MIN NO REPORT    Intraoperative interpretation by procedural physician at [facility].    Standing Status:   Standing    Number of Occurrences:   1    Reason for exam::   Assistance in needle guidance and placement for procedures requiring needle placement in or near specific anatomical locations not easily accessible without such assistance.    Medications ordered for procedure: Meds ordered this encounter  Medications   lidocaine  (XYLOCAINE ) 2 % (with pres) injection 400 mg   lactated ringers  infusion   midazolam  PF (VERSED ) injection 0.5-2 mg    Make sure Flumazenil is available in the pyxis when using this medication. If oversedation occurs, administer 0.2 mg IV over 15 sec. If after 45 sec no response, administer 0.2 mg again over 1 min; may repeat at 1 min intervals; not to exceed 4 doses (1 mg)   ropivacaine  (PF) 2 mg/mL (0.2%) (NAROPIN ) injection 18 mL   dexamethasone  (DECADRON ) injection 20 mg   Medications administered: We administered lidocaine , lactated ringers , midazolam  PF, ropivacaine  (PF) 2 mg/mL (0.2%), and dexamethasone .  See the medical record for exact dosing, route, and time of administration.    Bilateral lateral femoral cutaneous nerve block 03/06/2024. B/L L3,4,5 MBNB 05/27/24    Follow-up plan:  Return in about 5 weeks (around 07/01/2024) for PPE, F2F, B/L L3,4,5 MBNB.     Recent Visits Date Type Provider Dept  04/04/24 Office Visit Marcelino Nurse, MD [facility]  03/06/24 Procedure visit Marcelino Nurse, MD [facility]  Showing recent visits within past 90 days and meeting all other requirements Today's Visits Date Type Provider Dept  05/27/24 Procedure visit Marcelino Nurse, MD [facility]  Showing today's visits and meeting all other requirements Future Appointments Date Type Provider Dept  07/02/24  Appointment Marcelino Nurse, MD [facility]  Showing future appointments within next 90 days and meeting all other requirements   Disposition: Discharge home  Discharge (Date  Time): 05/27/2024; 1115 hrs.   Primary Care Physician: Cleatus Arlyss RAMAN, MD Location: [facility] Note by: Nurse Marcelino, MD (TTS technology used. I apologize for any typographical errors that were not detected and corrected.) Date: 05/27/2024; Time: 11:08 AM  Disclaimer:  Medicine is not an visual merchandiser. The only guarantee in medicine is that nothing is guaranteed. It is important to note that the decision to proceed with this intervention was based on the information collected from the patient. The Data and conclusions were drawn from the patient's questionnaire, the interview, and the physical examination. Because the information was provided in large part by the patient, it cannot be guaranteed that it has not been purposely or unconsciously manipulated. Every effort has been made to obtain as much relevant data as possible for this evaluation. It is important to note that the conclusions that lead to this procedure are derived in large part from the available data. Always take into account that the treatment will also be dependent on availability of resources and existing treatment guidelines, considered by other Pain Management Practitioners as being common knowledge and practice, at the time of the intervention. For Medico-Legal purposes, it is also important to point out that variation in procedural techniques and pharmacological choices are the acceptable norm. The indications, contraindications, technique, and results of the above procedure should only be interpreted and judged by a Board-Certified Interventional Pain Specialist with extensive familiarity and expertise in the same exact procedure and technique.

## 2024-05-27 NOTE — Patient Instructions (Signed)

## 2024-05-27 NOTE — Progress Notes (Signed)
 Safety precautions to be maintained throughout the outpatient stay will include: orient to surroundings, keep bed in low position, maintain call bell within reach at all times, provide assistance with transfer out of bed and ambulation.

## 2024-05-28 ENCOUNTER — Telehealth: Payer: Self-pay | Admitting: *Deleted

## 2024-05-28 NOTE — Telephone Encounter (Signed)
 No problems post procedure.

## 2024-06-17 ENCOUNTER — Ambulatory Visit: Payer: Self-pay

## 2024-06-17 ENCOUNTER — Other Ambulatory Visit: Payer: Self-pay | Admitting: Family Medicine

## 2024-06-17 MED ORDER — PREDNISONE 20 MG PO TABS: ORAL_TABLET | ORAL | Status: DC

## 2024-06-17 MED ORDER — PREDNISONE 20 MG PO TABS: ORAL_TABLET | ORAL | 0 refills | Status: AC

## 2024-06-17 NOTE — Telephone Encounter (Signed)
 FYI Only or Action Required?: Action required by provider: clinical question for provider.  Patient was last seen in primary care on 04/16/2024 by Cleatus Arlyss RAMAN, MD.  Called Nurse Triage reporting Foot Pain.  Symptoms began several days ago.  Interventions attempted: Nothing.  Symptoms are: gradually worsening.Declines OV. Has pain, redness, swelling to left big toe. Asking for Prednisone . Please advise pt.  Triage Disposition: See Physician Within 24 Hours  Patient/caregiver understands and will follow disposition?: No, wishes to speak with PCPReason for Triage: gout flare up requesting meds left foot    Reason for Disposition  [1] Redness of the skin AND [2] no fever  Answer Assessment - Initial Assessment Questions 1. ONSET: When did the pain start?      Thursday 2. LOCATION: Where is the pain located?      Left big toe 3. PAIN: How bad is the pain?    (Scale 1-10; or mild, moderate, severe)     8 4. WORK OR EXERCISE: Has there been any recent work or exercise that involved this part of the body?      no 5. CAUSE: What do you think is causing the foot pain?     gout 6. OTHER SYMPTOMS: Do you have any other symptoms? (e.g., leg pain, rash, fever, numbness)     Red, swelling 7. PREGNANCY: Is there any chance you are pregnant? When was your last menstrual period?     N/a  Protocols used: Foot Pain-A-[facility]

## 2024-06-17 NOTE — Addendum Note (Signed)
 Addended by: CLEATUS LORELI RAMAN on: 06/17/2024 01:56 PM   Modules accepted: Orders

## 2024-06-17 NOTE — Telephone Encounter (Signed)
 Rx sent.  Please get checked if not improving with med.  Thanks.

## 2024-06-19 ENCOUNTER — Other Ambulatory Visit: Payer: Self-pay | Admitting: Family Medicine

## 2024-07-02 ENCOUNTER — Ambulatory Visit: Admitting: Student in an Organized Health Care Education/Training Program

## 2024-07-02 ENCOUNTER — Encounter: Payer: Self-pay | Admitting: Student in an Organized Health Care Education/Training Program

## 2024-07-02 VITALS — BP 153/89 | HR 85 | Temp 97.3°F | Resp 16 | Ht >= 80 in | Wt 320.0 lb

## 2024-07-02 DIAGNOSIS — G894 Chronic pain syndrome: Secondary | ICD-10-CM

## 2024-07-02 DIAGNOSIS — M47816 Spondylosis without myelopathy or radiculopathy, lumbar region: Secondary | ICD-10-CM | POA: Diagnosis not present

## 2024-07-02 NOTE — Progress Notes (Signed)
 Safety precautions to be maintained throughout the outpatient stay will include: orient to surroundings, keep bed in low position, maintain call bell within reach at all times, provide assistance with transfer out of bed and ambulation.

## 2024-07-02 NOTE — Patient Instructions (Signed)
 Pain Management Discharge Instructions  General Discharge Instructions :  If you need to reach your doctor call: Monday-Friday 8:00 am - 4:00 pm at [phone] or toll free [phone].  After clinic hours [phone] to have operator reach doctor.  Bring all of your medication bottles to all your appointments in the pain clinic.  To cancel or reschedule your appointment with Pain Management please remember to call 24 hours in advance to avoid a fee.  Refer to the educational materials which you have been given on: General Risks, I had my Procedure. Discharge Instructions, Post Sedation.  Post Procedure Instructions:  The drugs you were given will stay in your system until tomorrow, so for the next 24 hours you should not drive, make any legal decisions or drink any alcoholic beverages.  You may eat anything you prefer, but it is better to start with liquids then soups and crackers, and gradually work up to solid foods.  Please notify your doctor immediately if you have any unusual bleeding, trouble breathing or pain that is not related to your normal pain.  Depending on the type of procedure that was done, some parts of your body may feel week and/or numb.  This usually clears up by tonight or the next day.  Walk with the use of an assistive device or accompanied by an adult for the 24 hours.  You may use ice on the affected area for the first 24 hours.  Put ice in a Ziploc bag and cover with a towel and place against area 15 minutes on 15 minutes off.  You may switch to heat after 24 hours.Facet Blocks Patient Information  Description: The facets are joints in the spine between the vertebrae.  Like any joints in the body, facets can become irritated and painful.  Arthritis can also effect the facets.  By injecting steroids and local anesthetic in and around these joints, we can temporarily block the nerve supply to them.  Steroids act directly on irritated nerves and tissues to  reduce selling and inflammation which often leads to decreased pain.  Facet blocks may be done anywhere along the spine from the neck to the low back depending upon the location of your pain.   After numbing the skin with local anesthetic (like Novocaine), a small needle is passed onto the facet joints under x-ray guidance.  You may experience a sensation of pressure while this is being done.  The entire block usually lasts about 15-25 minutes.   Conditions which may be treated by facet blocks:  Low back/buttock pain Neck/shoulder pain Certain types of headaches  Preparation for the injection:  Do not eat any solid food or dairy products within 8 hours of your appointment. You may drink clear liquid up to 3 hours before appointment.  Clear liquids include water, black coffee, juice or soda.  No milk or cream please. You may take your regular medication, including pain medications, with a sip of water before your appointment.  Diabetics should hold regular insulin (if taken separately) and take 1/2 normal NPH dose the morning of the procedure.  Carry some sugar containing items with you to your appointment. A driver must accompany you and be prepared to drive you home after your procedure. Bring all your current medications with you. An IV may be inserted and sedation may be given at the discretion of the physician. A blood pressure cuff, EKG and other monitors will often be applied during the procedure.  Some patients may need to  have extra oxygen administered for a short period. You will be asked to provide medical information, including your allergies and medications, prior to the procedure.  We must know immediately if you are taking blood thinners (like Coumadin/Warfarin) or if you are allergic to IV iodine contrast (dye).  We must know if you could possible be pregnant.  Possible side-effects:  Bleeding from needle site Infection (rare, may require surgery) Nerve injury (rare) Numbness  & tingling (temporary) Difficulty urinating (rare, temporary) Spinal headache (a headache worse with upright posture) Light-headedness (temporary) Pain at injection site (serveral days) Decreased blood pressure (rare, temporary) Weakness in arm/leg (temporary) Pressure sensation in back/neck (temporary)   Call if you experience:  Fever/chills associated with headache or increased back/neck pain Headache worsened by an upright position New onset, weakness or numbness of an extremity below the injection site Hives or difficulty breathing (go to the emergency room) Inflammation or drainage at the injection site(s) Severe back/neck pain greater than usual New symptoms which are concerning to you  Please note:  Although the local anesthetic injected can often make your back or neck feel good for several hours after the injection, the pain will likely return. It takes 3-7 days for steroids to work.  You may not notice any pain relief for at least one week.  If effective, we will often do a series of 2-3 injections spaced 3-6 weeks apart to maximally decrease your pain.  After the initial series, you may be a candidate for a more permanent nerve block of the facets.  If you have any questions, please call #[phone] [facility]
